# Patient Record
Sex: Female | Born: 1965 | Race: White | Hispanic: No | Marital: Married | Smoking: Never smoker
Health system: Southern US, Community
[De-identification: ages and names within clinical notes are randomized; demographics above are authoritative.]

## PROBLEM LIST (undated history)

## (undated) DIAGNOSIS — E039 Hypothyroidism, unspecified: Secondary | ICD-10-CM

## (undated) DIAGNOSIS — E041 Nontoxic single thyroid nodule: Secondary | ICD-10-CM

## (undated) HISTORY — PX: BREAST SURGERY: SHX581

## (undated) HISTORY — PX: TONSILLECTOMY: SUR1361

---

## 1998-09-21 ENCOUNTER — Ambulatory Visit (HOSPITAL_COMMUNITY): Admission: RE | Admit: 1998-09-21 | Discharge: 1998-09-21 | Payer: Self-pay | Admitting: *Deleted

## 1998-11-03 ENCOUNTER — Observation Stay (HOSPITAL_COMMUNITY): Admission: RE | Admit: 1998-11-03 | Discharge: 1998-11-04 | Payer: Self-pay | Admitting: *Deleted

## 1998-11-20 HISTORY — PX: THYROIDECTOMY, PARTIAL: SHX18

## 2000-08-15 ENCOUNTER — Encounter: Admission: RE | Admit: 2000-08-15 | Discharge: 2000-08-15 | Payer: Self-pay | Admitting: *Deleted

## 2000-08-15 ENCOUNTER — Encounter: Payer: Self-pay | Admitting: *Deleted

## 2000-09-14 ENCOUNTER — Ambulatory Visit (HOSPITAL_COMMUNITY): Admission: RE | Admit: 2000-09-14 | Discharge: 2000-09-14 | Payer: Self-pay | Admitting: Gastroenterology

## 2003-11-26 ENCOUNTER — Other Ambulatory Visit: Admission: RE | Admit: 2003-11-26 | Discharge: 2003-11-26 | Payer: Self-pay | Admitting: Obstetrics and Gynecology

## 2005-10-25 ENCOUNTER — Ambulatory Visit (HOSPITAL_COMMUNITY): Admission: RE | Admit: 2005-10-25 | Discharge: 2005-10-25 | Payer: Self-pay | Admitting: Gastroenterology

## 2005-10-27 ENCOUNTER — Encounter: Admission: RE | Admit: 2005-10-27 | Discharge: 2005-10-27 | Payer: Self-pay | Admitting: Gastroenterology

## 2007-09-20 ENCOUNTER — Inpatient Hospital Stay (HOSPITAL_COMMUNITY): Admission: EM | Admit: 2007-09-20 | Discharge: 2007-09-23 | Payer: Self-pay | Admitting: Emergency Medicine

## 2007-10-23 ENCOUNTER — Encounter: Admission: RE | Admit: 2007-10-23 | Discharge: 2007-10-23 | Payer: Self-pay | Admitting: Endocrinology

## 2007-11-27 ENCOUNTER — Encounter (INDEPENDENT_AMBULATORY_CARE_PROVIDER_SITE_OTHER): Payer: Self-pay | Admitting: Interventional Radiology

## 2007-11-27 ENCOUNTER — Other Ambulatory Visit: Admission: RE | Admit: 2007-11-27 | Discharge: 2007-11-27 | Payer: Self-pay | Admitting: Interventional Radiology

## 2007-11-27 ENCOUNTER — Encounter: Admission: RE | Admit: 2007-11-27 | Discharge: 2007-11-27 | Payer: Self-pay | Admitting: Endocrinology

## 2008-04-20 ENCOUNTER — Encounter: Admission: RE | Admit: 2008-04-20 | Discharge: 2008-04-20 | Payer: Self-pay | Admitting: Endocrinology

## 2009-11-26 ENCOUNTER — Emergency Department (HOSPITAL_BASED_OUTPATIENT_CLINIC_OR_DEPARTMENT_OTHER): Admission: EM | Admit: 2009-11-26 | Discharge: 2009-11-26 | Payer: Self-pay | Admitting: Emergency Medicine

## 2009-11-26 ENCOUNTER — Ambulatory Visit: Payer: Self-pay | Admitting: Diagnostic Radiology

## 2010-07-08 ENCOUNTER — Encounter: Admission: RE | Admit: 2010-07-08 | Discharge: 2010-07-08 | Payer: Self-pay | Admitting: Endocrinology

## 2010-12-11 ENCOUNTER — Encounter: Payer: Self-pay | Admitting: Endocrinology

## 2011-04-03 ENCOUNTER — Other Ambulatory Visit: Payer: Self-pay | Admitting: Obstetrics
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
## 2011-04-07 NOTE — Procedures (Signed)
[place]  Patient:    Ruth Soto, Ruth Soto                   MRN: [id number] Proc. Date: 09/14/00 Adm. Date:  [id number] Attending:  Charna Elizabeth CC:         Gail________, M.D., [facility]   Procedure Report  DATE OF BIRTH:  1966-04-24  PROCEDURE:  Colonoscopy.  ENDOSCOPIST:  Anselmo Rod, M.D.  INSTRUMENT USED:  Olympus video colonoscope.  INDICATIONS:  [age]-year-old white female with a history of rectal bleeding and family history in a brother who was diagnosed at 70 years of age. Rule out colonic polyps, masses, hemorrhoids, etc.  PREPROCEDURE PREPARATION:  Informed consent was procured from the patient. The patient was fasted for eight hours prior to the procedure and prepped with a bottle of magnesium citrate and a gallon of NuLytely the night prior to the procedure.  PREPROCEDURE PHYSICAL EXAMINATION:  VITAL SIGNS:  The patient has stable vital signs.  NECK:  Supple.  CHEST:  Clear to auscultation.  HEART:  S1 and S2 regular.  ABDOMEN:  Soft with normal abdominal bowel sounds.  DESCRIPTION OF PROCEDURE:  The patient was placed in the left lateral decubitus position and sedated with 50 mg of Demerol and 4 mg of Versed intravenously.  Once the patient was adequately sedated and maintained on low flow oxygen and continuous cardiac monitoring, the Olympus video colonoscope was advanced from the rectum to the cecum without difficulty.  No masses, polyps, erosions, ulcerations, diverticula, or other abnormalities were seen. There were small nonbleeding internal hemorrhoids seen on retroflexion in the rectum.  The patient tolerated the procedure well without complications.  IMPRESSION:  Essentially normal colonoscopy except for small internal hemorrhoids.  RECOMMENDATIONS: 1. The patient was advised to increase the fiber in her diet. 2. Consider the history of colon cancer in her brother, repeat colorectal    cancer screening has been recommended in the next 5 years the patient has    any abnormal symptoms in the interim. 3. Outpatient follow-up is advised on a p.r.n. basis. DD:  09/14/00 TD:  09/14/00 Job: [id number]

## 2011-04-07 NOTE — Op Note (Signed)
NAME:  Ruth Soto, Ruth Soto               ACCOUNT NO.:  [id number]   MEDICAL RECORD NO.:  [id number]          PATIENT TYPE:  AMB   LOCATION:  ENDO                         FACILITY:  [facility]   PHYSICIAN:  Anselmo Rod, M.D.  DATE OF BIRTH:  1966-11-11   DATE OF PROCEDURE:  10/25/2005  DATE OF DISCHARGE:                                 OPERATIVE REPORT   PROCEDURE PERFORMED:  Screening colonoscopy.   ENDOSCOPIST:  Anselmo Rod, M.D.   INSTRUMENT USED:  Olympus video colonoscope.   INDICATIONS FOR PROCEDURE:  A 45 year old white female with a family history  of colon cancer in  multiple family members including a brother who was  diagnosed at the age of 42 undergoing colonoscopy for rectal bleeding,  change in bowel habits, rule out colonic polyps, masses, etc.   PREPROCEDURE PREPARATION:  Informed consent was procured from the patient.  The patient fasted for four hours prior to the procedure and prepped with  Osmoprep pills the night of and the morning of the procedure.  Risks and  benefits of the procedure including a 10% miss rate of cancer and polyps was  discussed with the patient as well.   PREPROCEDURE PHYSICAL:  VITAL SIGNS:  Stable vital signs.  NECK:  Supple.  CHEST:  Clear to auscultation.  CARDIOVASCULAR:  S1 and S2 regular.  ABDOMEN:  Soft with normal bowel sounds.   DESCRIPTION OF PROCEDURE:  The patient was placed in left lateral decubitus  position, sedated with 100 mcg of Fentanyl and 10 mg of Versed in slow  incremental doses.  Once the patient was adequately sedated and maintained  on low flow oxygen and continuous cardiac monitoring, the Olympus video  colonoscope was advanced from the rectum to the cecum.  The patient had  fairly good prep.  Small internal hemorrhoids and a small anal fissure were  noted on anal inspection and retroflexion, respectively.  The rest of the  exam was unremarkable.  The appendiceal orifice and ileocecal valve were  clearly  visualized and photographed.  No other masses, polyps, erosions or  ulcerations or diverticula  were seen.  The patient tolerated the procedure  well without any immediate complications.   IMPRESSION:  Normal colonoscopy up the cecum except for small internal  hemorrhoids and a small anal fissure.   RECOMMENDATIONS:  1.Continue high fiber diet with liberal fluid intake.  2.SITZ baths along with local steroid application in the rectum has been  recommended.  3.Outpatient follow-up as need arises in the future.      Anselmo Rod, M.D.  Electronically Signed     JNM/MEDQ  D:  10/25/2005  T:  10/25/2005  Job:  [id number]   cc:   Maryla Morrow. Modesto Charon, M.D.  Fax: [phone]

## 2011-08-29 LAB — BASIC METABOLIC PANEL
Chloride: 105
Creatinine, Ser: 0.98
GFR calc Af Amer: >60
GFR calc non Af Amer: >60
Glucose, Bld: 144 — ABNORMAL HIGH

## 2011-08-29 LAB — CBC
Hemoglobin: 12.7
MCV: 91.8
Platelets: 354
RDW: 13.5
WBC: 12.6 — ABNORMAL HIGH

## 2011-08-30 LAB — URINE MICROSCOPIC-ADD ON

## 2011-08-30 LAB — I-STAT 8, (EC8 V) (CONVERTED LAB)
Acid-base deficit: 6 — ABNORMAL HIGH
BUN: 10
Bicarbonate: 19.1 — ABNORMAL LOW
Chloride: 105
Glucose, Bld: 134 — ABNORMAL HIGH
HCT: 45
Hemoglobin: 15.3 — ABNORMAL HIGH
Operator id: 234501
Potassium: 3 — ABNORMAL LOW
Sodium: 138
TCO2: 20
pCO2, Ven: 35 — ABNORMAL LOW
pH, Ven: 7.345 — ABNORMAL HIGH

## 2011-08-30 LAB — URINALYSIS, ROUTINE W REFLEX MICROSCOPIC
Ketones, ur: NEGATIVE
Leukocytes, UA: NEGATIVE
Nitrite: POSITIVE — AB
Protein, ur: >300 — AB
Specific Gravity, Urine: 1.025
Urobilinogen, UA: 1

## 2011-08-30 LAB — POCT I-STAT CREATININE
Creatinine, Ser: 1
Operator id: 234501

## 2012-03-08 ENCOUNTER — Other Ambulatory Visit: Payer: Self-pay | Admitting: Endocrinology

## 2012-03-08 DIAGNOSIS — E049 Nontoxic goiter, unspecified: Secondary | ICD-10-CM

## 2012-03-29 ENCOUNTER — Other Ambulatory Visit: Payer: Self-pay

## 2012-04-02 ENCOUNTER — Ambulatory Visit
Admission: RE | Admit: 2012-04-02 | Discharge: 2012-04-02 | Disposition: A | Discharge status: Home / Self Care | Payer: 59 | Source: Ambulatory Visit | Attending: Endocrinology | Admitting: Endocrinology

## 2012-04-02 DIAGNOSIS — E049 Nontoxic goiter, unspecified: Secondary | ICD-10-CM

## 2013-03-06 ENCOUNTER — Other Ambulatory Visit: Payer: Self-pay | Admitting: Endocrinology

## 2013-03-06 DIAGNOSIS — E049 Nontoxic goiter, unspecified: Secondary | ICD-10-CM

## 2013-06-02 ENCOUNTER — Other Ambulatory Visit: Payer: 59

## 2013-06-06 ENCOUNTER — Ambulatory Visit
Admission: RE | Admit: 2013-06-06 | Discharge: 2013-06-06 | Disposition: A | Discharge status: Home / Self Care | Payer: BC Managed Care – PPO | Source: Ambulatory Visit | Attending: Endocrinology | Admitting: Endocrinology

## 2013-06-06 DIAGNOSIS — E049 Nontoxic goiter, unspecified: Secondary | ICD-10-CM

## 2014-04-25 IMAGING — US US SOFT TISSUE HEAD/NECK
1 series · 14 of 25 positions shown · non-contrast
Comparison: 04/02/2012 and earlier studies

CLINICAL DATA: Goiter.  Previous FNA biopsy of the to right-sided
lesions 11/27/2007.  Previous left partial thyroidectomy.

THYROID ULTRASOUND
TECHNIQUE: Ultrasound examination of the thyroid gland and adjacent
soft tissues was performed.

[Series 1: us soft tissue head/neck · 0.06mm/px · 14 of 52 slices shown]
[im 1/52]
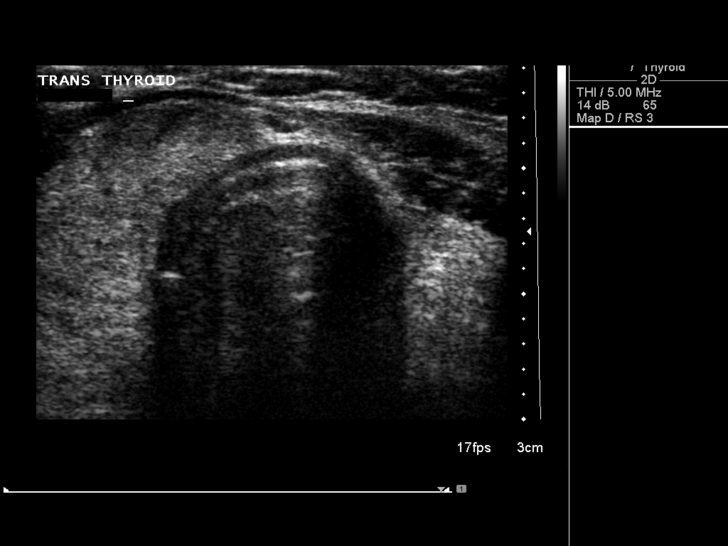
[im 5/52]
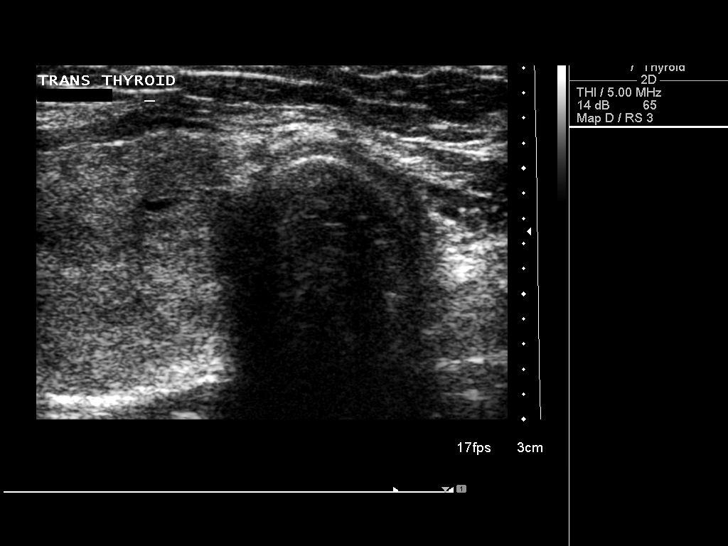
[im 9/52]
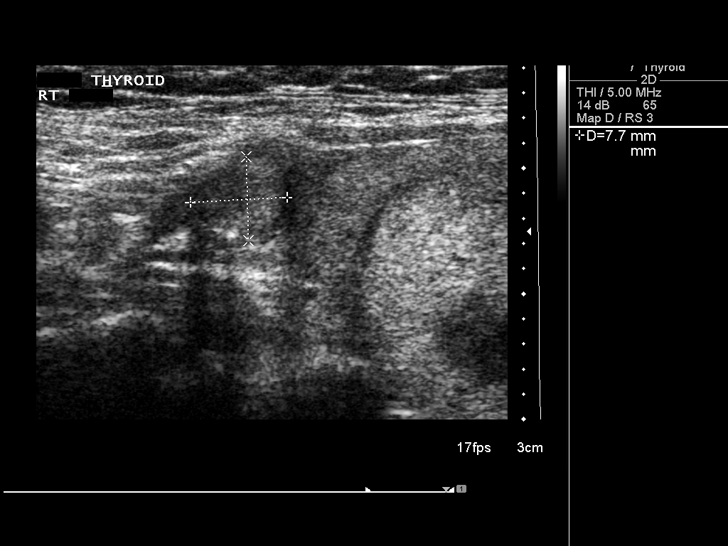
[im 13/52]
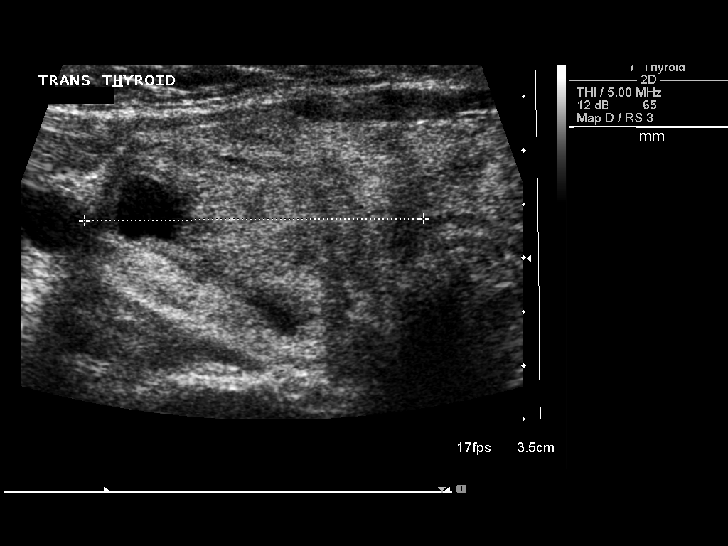
[im 18/52]
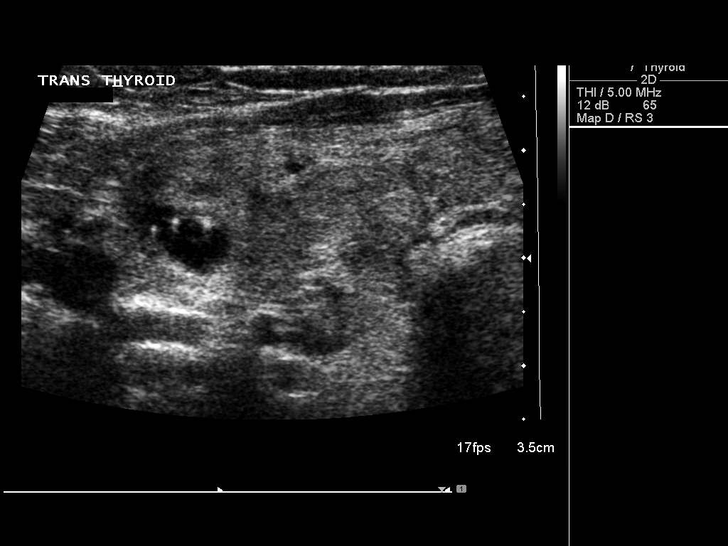
[im 20/52]
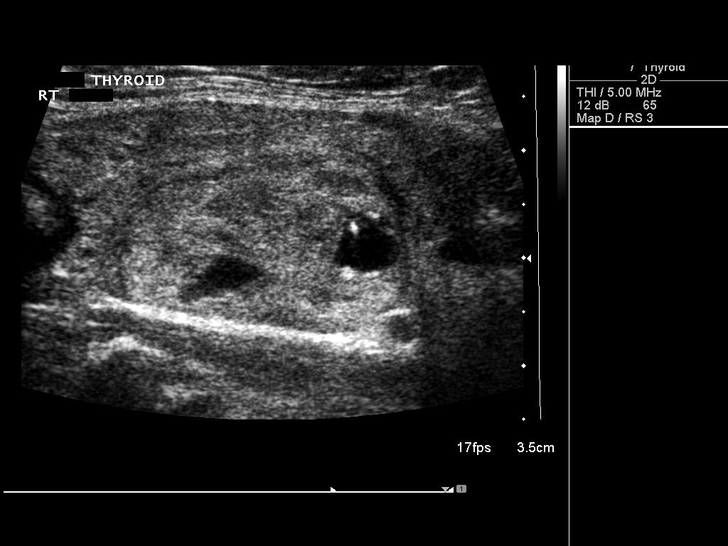
[im 24/52]
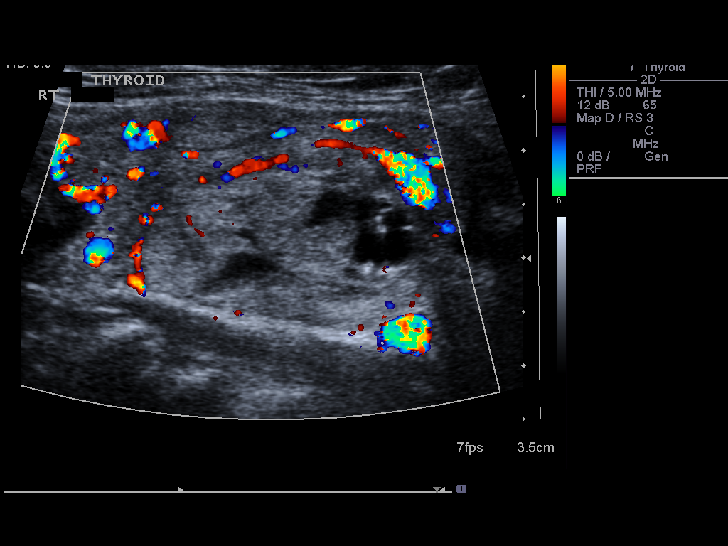
[im 28/52]
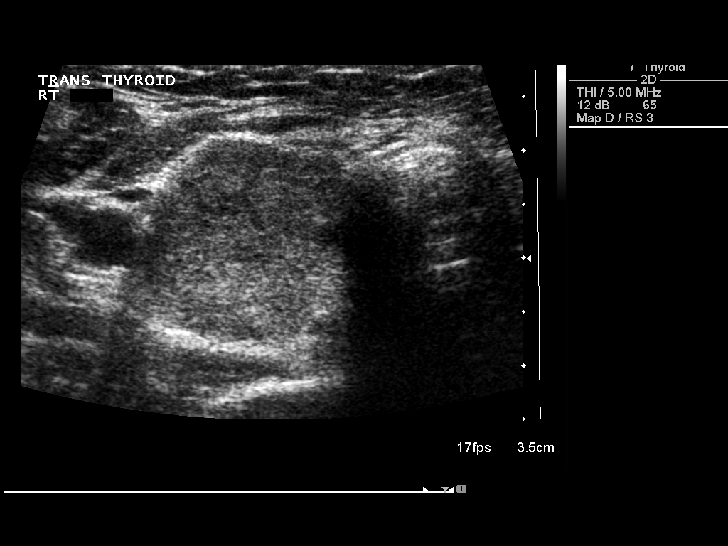
[im 32/52]
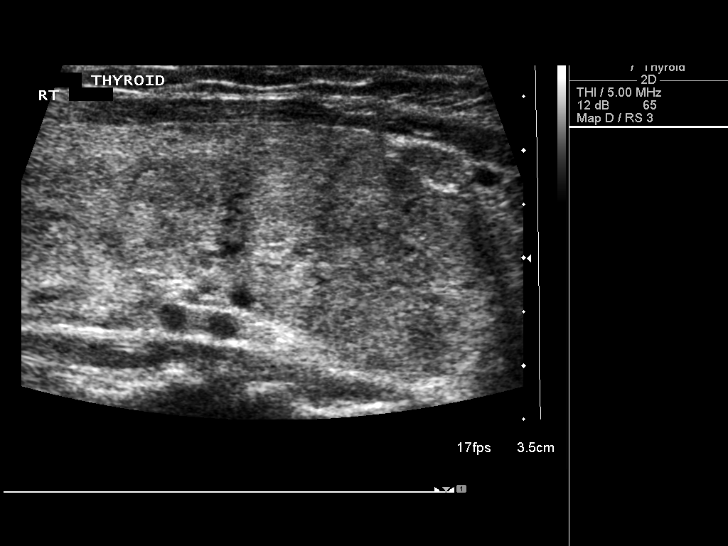
[im 35/52]
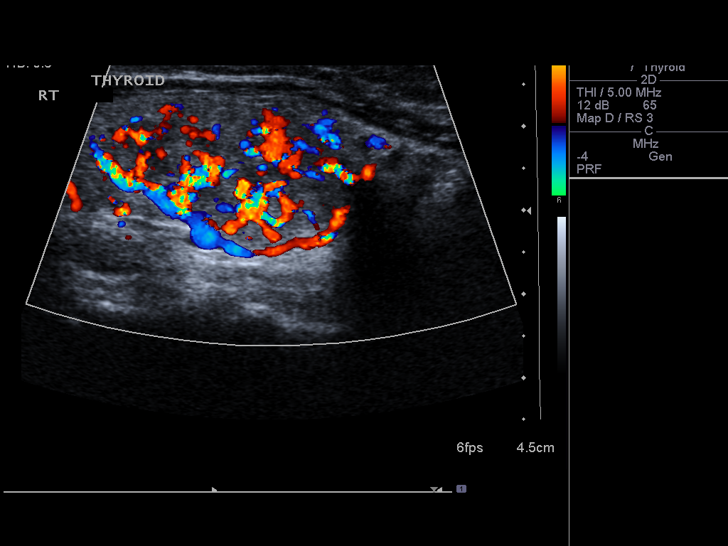
[im 39/52]
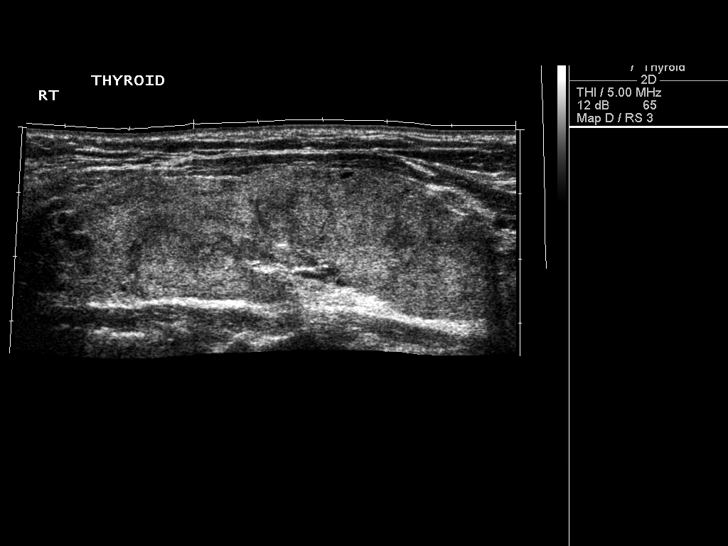
[im 43/52]
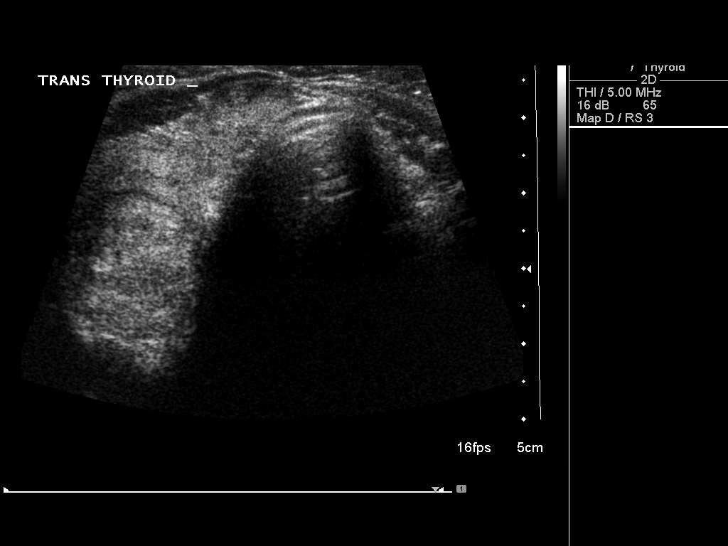
[im 47/52]
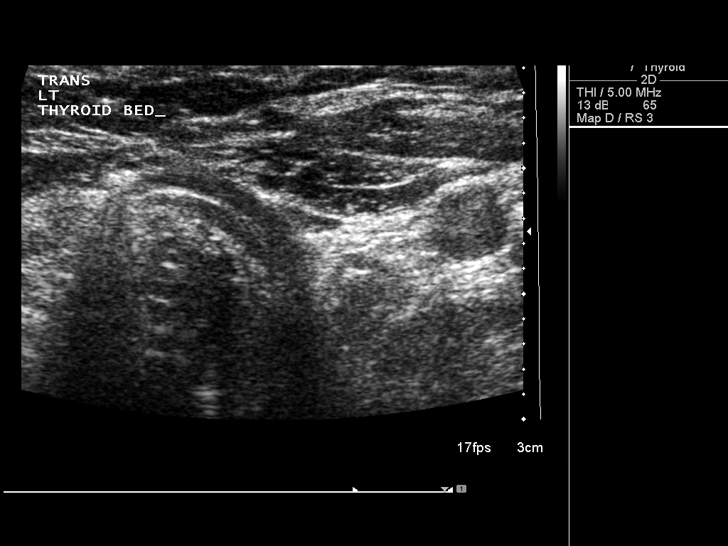
[im 52/52]
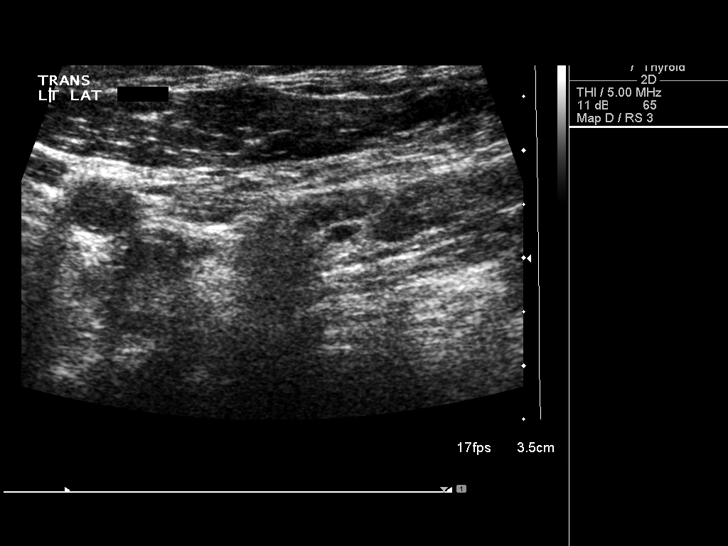

[14 of 25 positions shown; findings below may reference images not displayed]

FINDINGS: Right thyroid lobe:  8 x 2.5 x 3.2 cm, inhomogeneous echotexture
Left thyroid lobe:  Surgically absent without any residual or
recurrent tissue evident
Isthmus:  5.5 mm in thickness

Focal nodules:  7 x 8 x 9 mm solid with peripheral calcifications,
superior right
x 27)

Lymphadenopathy:  None visualized.
IMPRESSION: 1.  Stable nodules in an enlarged right thyroid lobe.  Correlate
with previous biopsy results.
2.  Changes of left hemithyroidectomy without residual or recurrent
tissue evident.

## 2014-08-07 ENCOUNTER — Other Ambulatory Visit: Payer: Self-pay | Admitting: Endocrinology

## 2014-08-07 DIAGNOSIS — E049 Nontoxic goiter, unspecified: Secondary | ICD-10-CM

## 2014-08-10 ENCOUNTER — Ambulatory Visit
Admission: RE | Admit: 2014-08-10 | Discharge: 2014-08-10 | Disposition: A | Discharge status: Home / Self Care | Payer: BC Managed Care – PPO | Source: Ambulatory Visit | Attending: Endocrinology | Admitting: Endocrinology

## 2014-08-10 DIAGNOSIS — E049 Nontoxic goiter, unspecified: Secondary | ICD-10-CM

## 2014-09-09 ENCOUNTER — Other Ambulatory Visit: Payer: Self-pay | Admitting: Obstetrics

## 2014-09-09 DIAGNOSIS — R928 Other abnormal and inconclusive findings on diagnostic imaging of breast: Secondary | ICD-10-CM

## 2014-09-18 ENCOUNTER — Ambulatory Visit
Admission: RE | Admit: 2014-09-18 | Discharge: 2014-09-18 | Disposition: A | Discharge status: Home / Self Care | Payer: BC Managed Care – PPO | Source: Ambulatory Visit | Attending: Obstetrics | Admitting: Obstetrics

## 2014-09-18 DIAGNOSIS — R928 Other abnormal and inconclusive findings on diagnostic imaging of breast: Secondary | ICD-10-CM

## 2015-04-07 ENCOUNTER — Other Ambulatory Visit: Payer: Self-pay | Admitting: Obstetrics

## 2015-04-07 DIAGNOSIS — R921 Mammographic calcification found on diagnostic imaging of breast: Secondary | ICD-10-CM

## 2015-04-15 ENCOUNTER — Ambulatory Visit
Admission: RE | Admit: 2015-04-15 | Discharge: 2015-04-15 | Disposition: A | Discharge status: Home / Self Care | Payer: BLUE CROSS/BLUE SHIELD | Source: Ambulatory Visit | Attending: Obstetrics | Admitting: Obstetrics

## 2015-04-15 ENCOUNTER — Other Ambulatory Visit: Payer: Self-pay | Admitting: Obstetrics

## 2015-04-15 DIAGNOSIS — R921 Mammographic calcification found on diagnostic imaging of breast: Secondary | ICD-10-CM

## 2015-04-20 ENCOUNTER — Other Ambulatory Visit: Payer: Self-pay | Admitting: Obstetrics

## 2015-04-20 DIAGNOSIS — R921 Mammographic calcification found on diagnostic imaging of breast: Secondary | ICD-10-CM

## 2015-04-22 ENCOUNTER — Ambulatory Visit
Admission: RE | Admit: 2015-04-22 | Discharge: 2015-04-22 | Disposition: A | Discharge status: Home / Self Care | Payer: BLUE CROSS/BLUE SHIELD | Source: Ambulatory Visit | Attending: Obstetrics | Admitting: Obstetrics

## 2015-04-22 DIAGNOSIS — R921 Mammographic calcification found on diagnostic imaging of breast: Secondary | ICD-10-CM

## 2015-04-26 ENCOUNTER — Ambulatory Visit: Payer: Self-pay | Admitting: Surgery

## 2015-04-26 DIAGNOSIS — D242 Benign neoplasm of left breast: Secondary | ICD-10-CM

## 2015-04-26 NOTE — H&P (Signed)
risty Tutton 04/26/2015 11:46 AM Location: [facility] Patient #: [id number] DOB: 1966/09/05 Married / Language: Cleophus Molt / Race: White Female History of Present Illness Marcello Moores A. Ajax Schroll MD; 04/26/2015 12:54 PM) Patient words: check breast lump Pt sent at the request of Dr Shelly Bombard for left breast papilloma and calcifications on mammogram. No hx of mass, pain or discharge. Had a milk duct excisied many rears ago.       CLINICAL DATA: Followup of calcifications in the left breast. Patient has a history of and the prior surgical excision of the left breast, per the patient in 2001. She states she had a milk duct removed at that time. EXAM: DIGITAL DIAGNOSTIC LEFT MAMMOGRAM WITH 3D TOMOSYNTHESIS AND CAD COMPARISON: 09/18/2014, 09/01/2014 ACR Breast Density Category: B. There are scattered fibroglandular densities. FINDINGS: No distortion is identified in the left breast. There is a is tubular density in the anterior third of the outer left breast slightly superior to the level of the nipple that is associated with a 12 x 15 x 13 mm group of coarse, heterogeneous calcifications. Some of the calcifications are linearly oriented. Mammographic images were processed with CAD. IMPRESSION: Group of heterogeneous calcifications with associated density in the anterior third of the left breast. Stereotactic biopsy is suggested. RECOMMENDATION: Stereotactic biopsy of left breast calcifications is recommended. This was scheduled for the patient, for next Thursday, April 22, 2015. I have discussed the findings and recommendations with the patient. Results were also provided in writing at the conclusion of the visit. If applicable, a reminder letter will be sent to the patient regarding the next appointment. BI-RADS CATEGORY 4: Suspicious. Electronically Signed By: Curlene Dolphin M.D. On: 04/15/2015 14:28   CLINICAL DATA: Post biopsy of calcifications in the upper-outer left  breast.  EXAM: DIAGNOSTIC LEFT MAMMOGRAM POST STEREOTACTIC BIOPSY  COMPARISON: Previous exam(s).  FINDINGS: Mammographic images were obtained following stereotactic guided biopsy of a loose group of calcifications in the upper-outer left breast anterior depth. A coil shaped biopsy marking clip is present in the targeted region of the left breast calcifications.  IMPRESSION: Appropriate positioning of coil shaped biopsy marking clip post biopsy of calcifications in the upper-outer left breast.  Final Assessment: Post Procedure Mammograms for Marker Placement   Electronically Signed By: Everlean Alstrom M.D. On: 04/22/2015 09:50         Breast, left, needle core biopsy, upper outer - SCLEROSED INTRADUCTAL PAPILLOMA WITH CALCIFICATIONS. - SEE COMMENT. Microscopic Comment The results were called to [facility] on 04/23/15. (JBK:gt, 04/23/15).  The patient is a 49 year old female   Other Problems Elbert Ewings, CMA; 04/26/2015 11:47 AM) Lump In Breast Thyroid Disease  Past Surgical History Elbert Ewings, CMA; 04/26/2015 11:47 AM) Breast Biopsy Left. Cesarean Section - 1 Oral Surgery Thyroid Surgery Tonsillectomy  Diagnostic Studies History Elbert Ewings, CMA; 04/26/2015 11:47 AM) Colonoscopy 5-10 years ago Mammogram within last year Pap Smear 1-5 years ago  Allergies Elbert Ewings, CMA; 04/26/2015 11:47 AM) No Known Drug Allergies 04/26/2015  Medication History Elbert Ewings, CMA; 04/26/2015 11:47 AM) Levothyroxine Sodium (50MCG Tablet, Oral) Active. Medications Reconciled  Social History Elbert Ewings, [place]; 04/26/2015 11:47 AM) Alcohol use Moderate alcohol use. Caffeine use Carbonated beverages, Coffee, Tea. No drug use Tobacco use Current some day smoker.  Family History Elbert Ewings, [place]; 04/26/2015 11:47 AM) Arthritis Mother. Colon Cancer Brother. Seizure disorder Brother. Thyroid problems Mother.  Pregnancy / Birth  History Elbert Ewings, CMA; 04/26/2015 11:47 AM) Age at menarche 17 years. Contraceptive  History Intrauterine device. Gravida 1 Irregular periods Maternal age 40-25 Para 1     Review of Systems Elbert Ewings CMA; 04/26/2015 11:47 AM) General Not Present- Appetite Loss, Chills, Fatigue, Fever, Night Sweats, Weight Gain and Weight Loss. Skin Not Present- Change in Wart/Mole, Dryness, Hives, Jaundice, New Lesions, Non-Healing Wounds, Rash and Ulcer. HEENT Not Present- Earache, Hearing Loss, Hoarseness, Nose Bleed, Oral Ulcers, Ringing in the Ears, Seasonal Allergies, Sinus Pain, Sore Throat, Visual Disturbances, Wears glasses/contact lenses and Yellow Eyes. Respiratory Not Present- Bloody sputum, Chronic Cough, Difficulty Breathing, Snoring and Wheezing. Breast Present- Breast Mass. Not Present- Breast Pain, Nipple Discharge and Skin Changes. Cardiovascular Not Present- Chest Pain, Difficulty Breathing Lying Down, Leg Cramps, Palpitations, Rapid Heart Rate, Shortness of Breath and Swelling of Extremities. Gastrointestinal Not Present- Abdominal Pain, Bloating, Bloody Stool, Change in Bowel Habits, Chronic diarrhea, Constipation, Difficulty Swallowing, Excessive gas, Gets full quickly at meals, Hemorrhoids, Indigestion, Nausea, Rectal Pain and Vomiting. Female Genitourinary Not Present- Frequency, Nocturia, Painful Urination, Pelvic Pain and Urgency. Musculoskeletal Not Present- Back Pain, Joint Pain, Joint Stiffness, Muscle Pain, Muscle Weakness and Swelling of Extremities. Neurological Not Present- Decreased Memory, Fainting, Headaches, Numbness, Seizures, Tingling, Tremor, Trouble walking and Weakness. Psychiatric Not Present- Anxiety, Bipolar, Change in Sleep Pattern, Depression, Fearful and Frequent crying. Endocrine Not Present- Cold Intolerance, Excessive Hunger, Hair Changes, Heat Intolerance, Hot flashes and New Diabetes. Hematology Not Present- Easy Bruising, Excessive bleeding, Gland  problems, HIV and Persistent Infections.  Vitals Elbert Ewings CMA; 04/26/2015 11:48 AM) 04/26/2015 11:47 AM Weight: 207 lb Height: 67in Body Surface Area: 2.11 m Body Mass Index: 32.42 kg/m Temp.: 99.107F(Oral)  Pulse: 93 (Irregular)  Resp.: 17 (Unlabored)  BP: 128/78 (Sitting, Left Arm, Standard) patient had a cup of hot tea before temp. was taken.    Physical Exam (Tiondra Fang A. Delila Kuklinski MD; 04/26/2015 12:54 PM)  General Mental Status-Alert. General Appearance-Consistent with stated age. Hydration-Well hydrated. Voice-Normal.  Head and Neck Head-normocephalic, atraumatic with no lesions or palpable masses. Trachea-midline. Thyroid Gland Characteristics - normal size and consistency.  Eye Eyeball - Bilateral-Extraocular movements intact. Sclera/Conjunctiva - Bilateral-No scleral icterus.  Chest and Lung Exam Chest and lung exam reveals -quiet, even and easy respiratory effort with no use of accessory muscles and on auscultation, normal breath sounds, no adventitious sounds and normal vocal resonance. Inspection Chest Wall - Normal. Back - normal.  Breast Breast - Left-Symmetric, Non Tender, No Biopsy scars, no Dimpling, No Inflammation, No Lumpectomy scars, No Mastectomy scars, No Peau d' Orange. Breast - Right-Symmetric, Non Tender, No Biopsy scars, no Dimpling, No Inflammation, No Lumpectomy scars, No Mastectomy scars, No Peau d' Orange. Breast Lump-No Palpable Breast Mass. Note: bruising left breast   Cardiovascular Cardiovascular examination reveals -normal heart sounds, regular rate and rhythm with no murmurs and normal pedal pulses bilaterally.  Abdomen Inspection Inspection of the abdomen reveals - No Hernias. Skin - Scar - no surgical scars. Palpation/Percussion Palpation and Percussion of the abdomen reveal - Soft, Non Tender, No Rebound tenderness, No Rigidity (guarding) and No hepatosplenomegaly. Auscultation Auscultation  of the abdomen reveals - Bowel sounds normal.  Neurologic Neurologic evaluation reveals -alert and oriented x 3 with no impairment of recent or remote memory. Mental Status-Normal.  Musculoskeletal Normal Exam - Left-Upper Extremity Strength Normal and Lower Extremity Strength Normal. Normal Exam - Right-Upper Extremity Strength Normal and Lower Extremity Strength Normal.  Lymphatic Head & Neck  General Head & Neck Lymphatics: Bilateral - Description - Normal. Axillary  General Axillary Region: Bilateral -  Description - Normal. Tenderness - Non Tender. Femoral & Inguinal  Generalized Femoral & Inguinal Lymphatics: Bilateral - Description - Normal. Tenderness - Non Tender.    Assessment & Plan (Anyjah Roundtree A. 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 Risk of ocult malignancyaround 3 %. She wishes to proceed with left breast seed localized lumpectomy. Risk of lumpectomy include bleeding, infection, seroma, more surgery, use of seed/wire, wound care, cosmetic deformity and the need for other treatments, death , blood clots, death. Pt agrees to proceed.  Current Plans Pt Education - Benign Tumors: discussed with patient and provided information. Pt Education - Breast Diseases: discussed with patient and provided information. Pt Education - [facility] Breast Biopsy HCI

## 2015-08-02 ENCOUNTER — Other Ambulatory Visit: Payer: Self-pay | Admitting: Endocrinology

## 2015-08-02 DIAGNOSIS — E049 Nontoxic goiter, unspecified: Secondary | ICD-10-CM

## 2015-09-06 ENCOUNTER — Ambulatory Visit: Payer: Self-pay | Admitting: Surgery

## 2015-09-06 ENCOUNTER — Other Ambulatory Visit: Payer: Self-pay | Admitting: Surgery

## 2015-09-06 DIAGNOSIS — D242 Benign neoplasm of left breast: Secondary | ICD-10-CM

## 2015-09-16 ENCOUNTER — Encounter (HOSPITAL_BASED_OUTPATIENT_CLINIC_OR_DEPARTMENT_OTHER): Payer: Self-pay | Admitting: *Deleted

## 2015-09-21 ENCOUNTER — Encounter (HOSPITAL_BASED_OUTPATIENT_CLINIC_OR_DEPARTMENT_OTHER)
Admission: RE | Admit: 2015-09-21 | Discharge: 2015-09-21 | Disposition: A | Discharge status: Home / Self Care | Payer: BLUE CROSS/BLUE SHIELD | Source: Ambulatory Visit | Attending: Surgery | Admitting: Surgery

## 2015-09-21 ENCOUNTER — Ambulatory Visit
Admission: RE | Admit: 2015-09-21 | Discharge: 2015-09-21 | Disposition: A | Discharge status: Home / Self Care | Payer: BLUE CROSS/BLUE SHIELD | Source: Ambulatory Visit | Attending: Surgery | Admitting: Surgery

## 2015-09-21 DIAGNOSIS — Z800 Family history of malignant neoplasm of digestive organs: Secondary | ICD-10-CM | POA: Diagnosis not present

## 2015-09-21 DIAGNOSIS — Z79899 Other long term (current) drug therapy: Secondary | ICD-10-CM | POA: Diagnosis not present

## 2015-09-21 DIAGNOSIS — Z8349 Family history of other endocrine, nutritional and metabolic diseases: Secondary | ICD-10-CM | POA: Diagnosis not present

## 2015-09-21 DIAGNOSIS — D242 Benign neoplasm of left breast: Secondary | ICD-10-CM

## 2015-09-21 DIAGNOSIS — Z8261 Family history of arthritis: Secondary | ICD-10-CM | POA: Diagnosis not present

## 2015-09-21 DIAGNOSIS — E039 Hypothyroidism, unspecified: Secondary | ICD-10-CM | POA: Diagnosis not present

## 2015-09-21 DIAGNOSIS — F17200 Nicotine dependence, unspecified, uncomplicated: Secondary | ICD-10-CM | POA: Diagnosis not present

## 2015-09-21 DIAGNOSIS — Z820 Family history of epilepsy and other diseases of the nervous system: Secondary | ICD-10-CM | POA: Diagnosis not present

## 2015-09-21 LAB — COMPREHENSIVE METABOLIC PANEL
ALBUMIN: 4.5 g/dL (ref 3.5–5.0)
ALT: 38 U/L (ref 14–54)
ANION GAP: 6 (ref 5–15)
AST: 32 U/L (ref 15–41)
Alkaline Phosphatase: 82 U/L (ref 38–126)
BUN: 11 mg/dL (ref 6–20)
CHLORIDE: 103 mmol/L (ref 101–111)
CO2: 29 mmol/L (ref 22–32)
Calcium: 9.1 mg/dL (ref 8.9–10.3)
Creatinine, Ser: 0.8 mg/dL (ref 0.44–1.00)
GFR calc non Af Amer: >60 mL/min (ref >60)
GLUCOSE: 103 mg/dL — AB (ref 65–99)
POTASSIUM: 5.2 mmol/L — AB (ref 3.5–5.1)
SODIUM: 138 mmol/L (ref 135–145)
Total Bilirubin: 0.7 mg/dL (ref 0.3–1.2)
Total Protein: 7.4 g/dL (ref 6.5–8.1)

## 2015-09-21 LAB — CBC WITH DIFFERENTIAL/PLATELET
BASOS PCT: 1 %
Basophils Absolute: 0 10*3/uL (ref 0.0–0.1)
EOS PCT: 2 %
Eosinophils Absolute: 0.2 10*3/uL (ref 0.0–0.7)
HCT: 47.2 % — ABNORMAL HIGH (ref 36.0–46.0)
HEMOGLOBIN: 16.1 g/dL — AB (ref 12.0–15.0)
LYMPHS PCT: 31 %
Lymphs Abs: 2.4 10*3/uL (ref 0.7–4.0)
MCH: 32.1 pg (ref 26.0–34.0)
MCHC: 34.1 g/dL (ref 30.0–36.0)
MCV: 94.2 fL (ref 78.0–100.0)
Monocytes Absolute: 0.5 10*3/uL (ref 0.1–1.0)
Monocytes Relative: 7 %
Neutro Abs: 4.5 10*3/uL (ref 1.7–7.7)
Neutrophils Relative %: 59 %
PLATELETS: 324 10*3/uL (ref 150–400)
RBC: 5.01 MIL/uL (ref 3.87–5.11)
RDW: 12.8 % (ref 11.5–15.5)
WBC: 7.5 10*3/uL (ref 4.0–10.5)

## 2015-09-23 ENCOUNTER — Encounter (HOSPITAL_BASED_OUTPATIENT_CLINIC_OR_DEPARTMENT_OTHER): Payer: Self-pay | Admitting: Anesthesiology

## 2015-09-23 ENCOUNTER — Ambulatory Visit (HOSPITAL_BASED_OUTPATIENT_CLINIC_OR_DEPARTMENT_OTHER)
Admission: RE | Admit: 2015-09-23 | Discharge: 2015-09-23 | Disposition: A | Discharge status: Home / Self Care | Payer: BLUE CROSS/BLUE SHIELD | Source: Ambulatory Visit | Attending: Surgery | Admitting: Surgery

## 2015-09-23 ENCOUNTER — Ambulatory Visit (HOSPITAL_BASED_OUTPATIENT_CLINIC_OR_DEPARTMENT_OTHER): Payer: BLUE CROSS/BLUE SHIELD | Admitting: Anesthesiology

## 2015-09-23 ENCOUNTER — Encounter (HOSPITAL_BASED_OUTPATIENT_CLINIC_OR_DEPARTMENT_OTHER)
Admission: RE | Disposition: A | Discharge status: Home / Self Care | Payer: Self-pay | Source: Ambulatory Visit | Attending: Surgery

## 2015-09-23 ENCOUNTER — Ambulatory Visit
Admission: RE | Admit: 2015-09-23 | Discharge: 2015-09-23 | Disposition: A | Discharge status: Home / Self Care | Payer: BLUE CROSS/BLUE SHIELD | Source: Ambulatory Visit | Attending: Surgery | Admitting: Surgery

## 2015-09-23 DIAGNOSIS — D242 Benign neoplasm of left breast: Secondary | ICD-10-CM | POA: Diagnosis not present

## 2015-09-23 DIAGNOSIS — Z8349 Family history of other endocrine, nutritional and metabolic diseases: Secondary | ICD-10-CM | POA: Insufficient documentation

## 2015-09-23 DIAGNOSIS — F17200 Nicotine dependence, unspecified, uncomplicated: Secondary | ICD-10-CM | POA: Insufficient documentation

## 2015-09-23 DIAGNOSIS — Z8261 Family history of arthritis: Secondary | ICD-10-CM | POA: Insufficient documentation

## 2015-09-23 DIAGNOSIS — Z79899 Other long term (current) drug therapy: Secondary | ICD-10-CM | POA: Insufficient documentation

## 2015-09-23 DIAGNOSIS — Z820 Family history of epilepsy and other diseases of the nervous system: Secondary | ICD-10-CM | POA: Insufficient documentation

## 2015-09-23 DIAGNOSIS — E039 Hypothyroidism, unspecified: Secondary | ICD-10-CM | POA: Insufficient documentation

## 2015-09-23 DIAGNOSIS — Z800 Family history of malignant neoplasm of digestive organs: Secondary | ICD-10-CM | POA: Insufficient documentation

## 2015-09-23 HISTORY — DX: Hypothyroidism, unspecified: E03.9

## 2015-09-23 HISTORY — DX: Nontoxic single thyroid nodule: E04.1

## 2015-09-23 HISTORY — PX: BREAST LUMPECTOMY WITH RADIOACTIVE SEED LOCALIZATION: SHX6424

## 2015-09-23 SURGERY — BREAST LUMPECTOMY WITH RADIOACTIVE SEED LOCALIZATION
Anesthesia: General | Site: Breast | Laterality: Left

## 2015-09-23 MED ORDER — OXYCODONE HCL 5 MG/5ML PO SOLN: 5.0000 mg | Freq: Once | ORAL | Status: AC | PRN

## 2015-09-23 MED ORDER — FENTANYL CITRATE (PF) 100 MCG/2ML IJ SOLN
INTRAMUSCULAR | Status: AC
  Filled 2015-09-23: qty 4

## 2015-09-23 MED ORDER — OXYCODONE HCL 5 MG PO TABS
5.0000 mg | ORAL_TABLET | Freq: Once | ORAL | Status: AC | PRN
  Administered 2015-09-23: 5 mg via ORAL

## 2015-09-23 MED ORDER — LIDOCAINE HCL (CARDIAC) 20 MG/ML IV SOLN
INTRAVENOUS | Status: DC | PRN
  Administered 2015-09-23: 50 mg via INTRAVENOUS

## 2015-09-23 MED ORDER — MIDAZOLAM HCL 2 MG/2ML IJ SOLN
1.0000 mg | INTRAMUSCULAR | Status: DC | PRN
  Administered 2015-09-23: 2 mg via INTRAVENOUS

## 2015-09-23 MED ORDER — DEXAMETHASONE SODIUM PHOSPHATE 4 MG/ML IJ SOLN
INTRAMUSCULAR | Status: DC | PRN
  Administered 2015-09-23: 10 mg via INTRAVENOUS

## 2015-09-23 MED ORDER — ONDANSETRON HCL 4 MG/2ML IJ SOLN
INTRAMUSCULAR | Status: DC | PRN
  Administered 2015-09-23: 4 mg via INTRAVENOUS

## 2015-09-23 MED ORDER — 0.9 % SODIUM CHLORIDE (POUR BTL) OPTIME
TOPICAL | Status: DC | PRN
  Administered 2015-09-23: 200 mL

## 2015-09-23 MED ORDER — PROPOFOL 500 MG/50ML IV EMUL
INTRAVENOUS | Status: AC
  Filled 2015-09-23: qty 50

## 2015-09-23 MED ORDER — SCOPOLAMINE 1 MG/3DAYS TD PT72: 1.0000 | MEDICATED_PATCH | Freq: Once | TRANSDERMAL | Status: DC | PRN

## 2015-09-23 MED ORDER — MEPERIDINE HCL 25 MG/ML IJ SOLN: 6.2500 mg | INTRAMUSCULAR | Status: DC | PRN

## 2015-09-23 MED ORDER — OXYCODONE HCL 5 MG PO TABS
ORAL_TABLET | ORAL | Status: AC
  Filled 2015-09-23: qty 1

## 2015-09-23 MED ORDER — PROPOFOL 10 MG/ML IV BOLUS
INTRAVENOUS | Status: DC | PRN
  Administered 2015-09-23: 200 mg via INTRAVENOUS

## 2015-09-23 MED ORDER — MIDAZOLAM HCL 2 MG/2ML IJ SOLN
INTRAMUSCULAR | Status: AC
  Filled 2015-09-23: qty 4

## 2015-09-23 MED ORDER — HYDROMORPHONE HCL 1 MG/ML IJ SOLN
0.2500 mg | INTRAMUSCULAR | Status: DC | PRN
  Administered 2015-09-23 (×2): 0.5 mg via INTRAVENOUS

## 2015-09-23 MED ORDER — CHLORHEXIDINE GLUCONATE 4 % EX LIQD: 1.0000 "application " | Freq: Once | CUTANEOUS | Status: DC

## 2015-09-23 MED ORDER — OXYCODONE-ACETAMINOPHEN 5-325 MG PO TABS: 1.0000 | ORAL_TABLET | ORAL | Status: AC | PRN

## 2015-09-23 MED ORDER — LIDOCAINE HCL (CARDIAC) 20 MG/ML IV SOLN
INTRAVENOUS | Status: AC
  Filled 2015-09-23: qty 5

## 2015-09-23 MED ORDER — HYDROMORPHONE HCL 1 MG/ML IJ SOLN
INTRAMUSCULAR | Status: AC
  Filled 2015-09-23: qty 1

## 2015-09-23 MED ORDER — FENTANYL CITRATE (PF) 100 MCG/2ML IJ SOLN
50.0000 ug | INTRAMUSCULAR | Status: AC | PRN
  Administered 2015-09-23: 100 ug via INTRAVENOUS
  Administered 2015-09-23 (×2): 50 ug via INTRAVENOUS

## 2015-09-23 MED ORDER — ONDANSETRON HCL 4 MG/2ML IJ SOLN
INTRAMUSCULAR | Status: AC
  Filled 2015-09-23: qty 2

## 2015-09-23 MED ORDER — GLYCOPYRROLATE 0.2 MG/ML IJ SOLN: 0.2000 mg | Freq: Once | INTRAMUSCULAR | Status: DC | PRN

## 2015-09-23 MED ORDER — DEXTROSE 5 % IV SOLN
3.0000 g | INTRAVENOUS | Status: AC
  Administered 2015-09-23: 2 g via INTRAVENOUS

## 2015-09-23 MED ORDER — BUPIVACAINE-EPINEPHRINE (PF) 0.25% -1:200000 IJ SOLN
INTRAMUSCULAR | Status: DC | PRN
  Administered 2015-09-23: 10 mL

## 2015-09-23 MED ORDER — DEXAMETHASONE SODIUM PHOSPHATE 10 MG/ML IJ SOLN
INTRAMUSCULAR | Status: AC
  Filled 2015-09-23: qty 1

## 2015-09-23 MED ORDER — LACTATED RINGERS IV SOLN
INTRAVENOUS | Status: DC
  Administered 2015-09-23: 10:00:00 via INTRAVENOUS

## 2015-09-23 MED ORDER — CEFAZOLIN SODIUM-DEXTROSE 2-3 GM-% IV SOLR
INTRAVENOUS | Status: AC
  Filled 2015-09-23: qty 50

## 2015-09-23 SURGICAL SUPPLY — 51 items
APPLIER CLIP 9.375 MED OPEN (MISCELLANEOUS)
APR CLP MED 9.3 20 MLT OPN (MISCELLANEOUS)
BINDER BREAST LRG (GAUZE/BANDAGES/DRESSINGS) IMPLANT
BINDER BREAST MEDIUM (GAUZE/BANDAGES/DRESSINGS) IMPLANT
BINDER BREAST XLRG (GAUZE/BANDAGES/DRESSINGS) ×2 IMPLANT
BINDER BREAST XXLRG (GAUZE/BANDAGES/DRESSINGS) IMPLANT
BLADE SURG 15 STRL LF DISP TIS (BLADE) ×1 IMPLANT
BLADE SURG 15 STRL SS (BLADE) ×3
CANISTER SUC SOCK COL 7IN (MISCELLANEOUS) ×3 IMPLANT
CANISTER SUCT 1200ML W/VALVE (MISCELLANEOUS) IMPLANT
CHLORAPREP W/TINT 26ML (MISCELLANEOUS) ×3 IMPLANT
CLIP APPLIE 9.375 MED OPEN (MISCELLANEOUS) IMPLANT
COVER BACK TABLE 60X90IN (DRAPES) ×3 IMPLANT
COVER MAYO STAND STRL (DRAPES) ×3 IMPLANT
COVER PROBE W GEL 5X96 (DRAPES) ×3 IMPLANT
DECANTER SPIKE VIAL GLASS SM (MISCELLANEOUS) IMPLANT
DEVICE DUBIN W/COMP PLATE 8390 (MISCELLANEOUS) ×3 IMPLANT
DRAPE LAPAROSCOPIC ABDOMINAL (DRAPES) IMPLANT
DRAPE LAPAROTOMY 100X72 PEDS (DRAPES) ×3 IMPLANT
DRAPE UTILITY XL STRL (DRAPES) ×3 IMPLANT
ELECT COATED BLADE 2.86 ST (ELECTRODE) ×3 IMPLANT
ELECT REM PT RETURN 9FT ADLT (ELECTROSURGICAL) ×3
ELECTRODE REM PT RTRN 9FT ADLT (ELECTROSURGICAL) ×1 IMPLANT
GLOVE BIO SURGEON STRL SZ 6.5 (GLOVE) ×1 IMPLANT
GLOVE BIO SURGEONS STRL SZ 6.5 (GLOVE) ×1
GLOVE BIOGEL PI IND STRL 7.0 (GLOVE) IMPLANT
GLOVE BIOGEL PI IND STRL 8 (GLOVE) ×1 IMPLANT
GLOVE BIOGEL PI INDICATOR 7.0 (GLOVE) ×4
GLOVE BIOGEL PI INDICATOR 8 (GLOVE) ×2
GLOVE ECLIPSE 8.0 STRL XLNG CF (GLOVE) ×3 IMPLANT
GOWN STRL REUS W/ TWL LRG LVL3 (GOWN DISPOSABLE) ×2 IMPLANT
GOWN STRL REUS W/TWL LRG LVL3 (GOWN DISPOSABLE) ×6
HEMOSTAT SNOW SURGICEL 2X4 (HEMOSTASIS) IMPLANT
KIT MARKER MARGIN INK (KITS) ×3 IMPLANT
LIQUID BAND (GAUZE/BANDAGES/DRESSINGS) ×3 IMPLANT
NDL HYPO 25X1 1.5 SAFETY (NEEDLE) ×1 IMPLANT
NEEDLE HYPO 25X1 1.5 SAFETY (NEEDLE) ×3 IMPLANT
NS IRRIG 1000ML POUR BTL (IV SOLUTION) ×3 IMPLANT
PACK BASIN DAY SURGERY FS (CUSTOM PROCEDURE TRAY) ×3 IMPLANT
PENCIL BUTTON HOLSTER BLD 10FT (ELECTRODE) ×3 IMPLANT
SLEEVE SCD COMPRESS KNEE MED (MISCELLANEOUS) ×3 IMPLANT
SPONGE LAP 4X18 X RAY DECT (DISPOSABLE) ×3 IMPLANT
SUT MNCRL AB 4-0 PS2 18 (SUTURE) ×3 IMPLANT
SUT SILK 2 0 SH (SUTURE) IMPLANT
SUT VICRYL 3-0 CR8 SH (SUTURE) ×3 IMPLANT
SYR CONTROL 10ML LL (SYRINGE) ×3 IMPLANT
TOWEL OR 17X24 6PK STRL BLUE (TOWEL DISPOSABLE) ×3 IMPLANT
TOWEL OR NON WOVEN STRL DISP B (DISPOSABLE) ×3 IMPLANT
TUBE CONNECTING 20'X1/4 (TUBING)
TUBE CONNECTING 20X1/4 (TUBING) IMPLANT
YANKAUER SUCT BULB TIP NO VENT (SUCTIONS) IMPLANT

## 2015-09-23 NOTE — Interval H&P Note (Signed)
History and Physical Interval Note:  09/23/2015 9:49 AM  Ruth Soto  has presented today for surgery, with the diagnosis of Papilloma Left Breast  The various methods of treatment have been discussed with the patient and family. After consideration of risks, benefits and other options for treatment, the patient has consented to  Procedure(s): LEFT BREAST LUMPECTOMY WITH RADIOACTIVE SEED LOCALIZATION (Left) as a surgical intervention .  The patient's history has been reviewed, patient examined, no change in status, stable for surgery.  I have reviewed the patient's chart and labs.  Questions were answered to the patient's satisfaction.     Tyrian Peart A.

## 2015-09-23 NOTE — H&P (Signed)
H&P   LORISSA KISHBAUGH (MR# [id number])      H&P Info    Author Note Status Last Update User Last Update Date/Time   Erroll Luna, MD Signed Erroll Luna, MD 04/26/2015 12:56 PM    H&P    Expand All Collapse All   risty Sermons 04/26/2015 11:46 AM Location: [facility] Patient #: [id number] DOB: 1966/02/21 Married / Language: Cleophus Molt / Race: White Female History of Present Illness Marcello Moores A. Tradarius Reinwald MD; 04/26/2015 12:54 PM) Patient words: check breast lump Pt sent at the request of Dr Shelly Bombard for left breast papilloma and calcifications on mammogram. No hx of mass, pain or discharge. Had a milk duct excisied many rears ago.       CLINICAL DATA: Followup of calcifications in the left breast. Patient has a history of and the prior surgical excision of the left breast, per the patient in 2001. She states she had a milk duct removed at that time. EXAM: DIGITAL DIAGNOSTIC LEFT MAMMOGRAM WITH 3D TOMOSYNTHESIS AND CAD COMPARISON: 09/18/2014, 09/01/2014 ACR Breast Density Category: B. There are scattered fibroglandular densities. FINDINGS: No distortion is identified in the left breast. There is a is tubular density in the anterior third of the outer left breast slightly superior to the level of the nipple that is associated with a 12 x 15 x 13 mm group of coarse, heterogeneous calcifications. Some of the calcifications are linearly oriented. Mammographic images were processed with CAD. IMPRESSION: Group of heterogeneous calcifications with associated density in the anterior third of the left breast. Stereotactic biopsy is suggested. RECOMMENDATION: Stereotactic biopsy of left breast calcifications is recommended. This was scheduled for the patient, for next Thursday, April 22, 2015. I have discussed the findings and recommendations with the patient. Results were also provided in writing at the conclusion of the visit. If applicable, a reminder letter will be sent to the  patient regarding the next appointment. BI-RADS CATEGORY 4: Suspicious. Electronically Signed By: Curlene Dolphin M.D. On: 04/15/2015 14:28   CLINICAL DATA: Post biopsy of calcifications in the upper-outer left breast.  EXAM: DIAGNOSTIC LEFT MAMMOGRAM POST STEREOTACTIC BIOPSY  COMPARISON: Previous exam(s).  FINDINGS: Mammographic images were obtained following stereotactic guided biopsy of a loose group of calcifications in the upper-outer left breast anterior depth. A coil shaped biopsy marking clip is present in the targeted region of the left breast calcifications.  IMPRESSION: Appropriate positioning of coil shaped biopsy marking clip post biopsy of calcifications in the upper-outer left breast.  Final Assessment: Post Procedure Mammograms for Marker Placement   Electronically Signed By: Everlean Alstrom M.D. On: 04/22/2015 09:50         Breast, left, needle core biopsy, upper outer - SCLEROSED INTRADUCTAL PAPILLOMA WITH CALCIFICATIONS. - SEE COMMENT. Microscopic Comment The results were called to [facility] on 04/23/15. (JBK:gt, 04/23/15).  The patient is a 49 year old female   Other Problems Elbert Ewings, CMA; 04/26/2015 11:47 AM) Lump In Breast Thyroid Disease  Past Surgical History Elbert Ewings, CMA; 04/26/2015 11:47 AM) Breast Biopsy Left. Cesarean Section - 1 Oral Surgery Thyroid Surgery Tonsillectomy  Diagnostic Studies History Elbert Ewings, CMA; 04/26/2015 11:47 AM) Colonoscopy 5-10 years ago Mammogram within last year Pap Smear 1-5 years ago  Allergies Elbert Ewings, CMA; 04/26/2015 11:47 AM) No Known Drug Allergies 04/26/2015  Medication History Elbert Ewings, CMA; 04/26/2015 11:47 AM) Levothyroxine Sodium (50MCG Tablet, Oral) Active. Medications Reconciled  Social History Elbert Ewings, [place]; 04/26/2015 11:47 AM) Alcohol use Moderate alcohol use. Caffeine  use Carbonated beverages, Coffee, Tea. No drug  use Tobacco use Current some day smoker.  Family History Elbert Ewings, [place]; 04/26/2015 11:47 AM) Arthritis Mother. Colon Cancer Brother. Seizure disorder Brother. Thyroid problems Mother.  Pregnancy / Birth History Elbert Ewings, CMA; 04/26/2015 11:47 AM) Age at menarche 74 years. Contraceptive History Intrauterine device. Gravida 1 Irregular periods Maternal age 21-25 Para 1     Review of Systems Elbert Ewings CMA; 04/26/2015 11:47 AM) General Not Present- Appetite Loss, Chills, Fatigue, Fever, Night Sweats, Weight Gain and Weight Loss. Skin Not Present- Change in Wart/Mole, Dryness, Hives, Jaundice, New Lesions, Non-Healing Wounds, Rash and Ulcer. HEENT Not Present- Earache, Hearing Loss, Hoarseness, Nose Bleed, Oral Ulcers, Ringing in the Ears, Seasonal Allergies, Sinus Pain, Sore Throat, Visual Disturbances, Wears glasses/contact lenses and Yellow Eyes. Respiratory Not Present- Bloody sputum, Chronic Cough, Difficulty Breathing, Snoring and Wheezing. Breast Present- Breast Mass. Not Present- Breast Pain, Nipple Discharge and Skin Changes. Cardiovascular Not Present- Chest Pain, Difficulty Breathing Lying Down, Leg Cramps, Palpitations, Rapid Heart Rate, Shortness of Breath and Swelling of Extremities. Gastrointestinal Not Present- Abdominal Pain, Bloating, Bloody Stool, Change in Bowel Habits, Chronic diarrhea, Constipation, Difficulty Swallowing, Excessive gas, Gets full quickly at meals, Hemorrhoids, Indigestion, Nausea, Rectal Pain and Vomiting. Female Genitourinary Not Present- Frequency, Nocturia, Painful Urination, Pelvic Pain and Urgency. Musculoskeletal Not Present- Back Pain, Joint Pain, Joint Stiffness, Muscle Pain, Muscle Weakness and Swelling of Extremities. Neurological Not Present- Decreased Memory, Fainting, Headaches, Numbness, Seizures, Tingling, Tremor, Trouble walking and Weakness. Psychiatric Not Present- Anxiety, Bipolar, Change in Sleep Pattern,  Depression, Fearful and Frequent crying. Endocrine Not Present- Cold Intolerance, Excessive Hunger, Hair Changes, Heat Intolerance, Hot flashes and New Diabetes. Hematology Not Present- Easy Bruising, Excessive bleeding, Gland problems, HIV and Persistent Infections.  Vitals Elbert Ewings CMA; 04/26/2015 11:48 AM) 04/26/2015 11:47 AM Weight: 207 lb Height: 67in Body Surface Area: 2.11 m Body Mass Index: 32.42 kg/m Temp.: 99.78F(Oral)  Pulse: 93 (Irregular)  Resp.: 17 (Unlabored)  BP: 128/78 (Sitting, Left Arm, Standard) patient had a cup of hot tea before temp. was taken.    Physical Exam (Leonore Frankson A. Bodee Lafoe MD; 04/26/2015 12:54 PM)  General Mental Status-Alert. General Appearance-Consistent with stated age. Hydration-Well hydrated. Voice-Normal.  Head and Neck Head-normocephalic, atraumatic with no lesions or palpable masses. Trachea-midline. Thyroid Gland Characteristics - normal size and consistency.  Eye Eyeball - Bilateral-Extraocular movements intact. Sclera/Conjunctiva - Bilateral-No scleral icterus.  Chest and Lung Exam Chest and lung exam reveals -quiet, even and easy respiratory effort with no use of accessory muscles and on auscultation, normal breath sounds, no adventitious sounds and normal vocal resonance. Inspection Chest Wall - Normal. Back - normal.  Breast Breast - Left-Symmetric, Non Tender, No Biopsy scars, no Dimpling, No Inflammation, No Lumpectomy scars, No Mastectomy scars, No Peau d' Orange. Breast - Right-Symmetric, Non Tender, No Biopsy scars, no Dimpling, No Inflammation, No Lumpectomy scars, No Mastectomy scars, No Peau d' Orange. Breast Lump-No Palpable Breast Mass. Note: bruising left breast   Cardiovascular Cardiovascular examination reveals -normal heart sounds, regular rate and rhythm with no murmurs and normal pedal pulses bilaterally.  Abdomen Inspection Inspection of the abdomen reveals - No  Hernias. Skin - Scar - no surgical scars. Palpation/Percussion Palpation and Percussion of the abdomen reveal - Soft, Non Tender, No Rebound tenderness, No Rigidity (guarding) and No hepatosplenomegaly. Auscultation Auscultation of the abdomen reveals - Bowel sounds normal.  Neurologic Neurologic evaluation reveals -alert and oriented x 3 with no impairment of recent or remote  memory. Mental Status-Normal.  Musculoskeletal Normal Exam - Left-Upper Extremity Strength Normal and Lower Extremity Strength Normal. Normal Exam - Right-Upper Extremity Strength Normal and Lower Extremity Strength Normal.  Lymphatic Head & Neck  General Head & Neck Lymphatics: Bilateral - Description - Normal. Axillary  General Axillary Region: Bilateral - Description - Normal. Tenderness - Non Tender. Femoral & Inguinal  Generalized Femoral & Inguinal Lymphatics: Bilateral - Description - Normal. Tenderness - Non Tender.    Assessment & Plan (Vivan Vanderveer A. 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 Risk of ocult malignancyaround 3 %. She wishes to proceed with left breast seed localized lumpectomy. Risk of lumpectomy include bleeding, infection, seroma, more surgery, use of seed/wire, wound care, cosmetic deformity and the need for other treatments, death , blood clots, death. Pt agrees to proceed.  Current Plans Pt Education - Benign Tumors: discussed with patient and provided information. Pt Education - Breast Diseases: discussed with patient and provided information. Pt Education - [facility] Breast Biopsy HCI

## 2015-09-23 NOTE — Op Note (Signed)
Preoperative diagnosis: Left breast papilloma  Postoperative diagnosis: Same   Procedure: Left breast seed localized lumpectomy  Surgeon: Erroll Luna M.D.  Anesthesia: Gen. With 0.25% Sensorcaine local  EBL: 20 cc  Specimen: Left breast tissue with clip and radioactive seed in the specimen. Verified with neoprobe and radiographic image showing both seed and clip in specimen  Indications for procedure: The patient presents for left breast  lumpectomy after core biopsy showed papilloma. Discussed the rationale for considering excision. Small risk of malignancy associated with papilloma lesion after core biopsy. Discussed observation. Discussed wire/ seed  localization. Patient desired excision of left breast papilloma.The procedure has been discussed with the patient. Alternatives to surgery have been discussed with the patient.  Risks of surgery include bleeding,  Infection,  Seroma formation, cosmetic deformity,   death,  and the need for further surgery.   The patient understands and wishes to proceed.   Description of procedure: Patient underwent seed placement as an outpatient. Patient presents today for left breast seed localized lumpectomy. Patient seen in holding area. Questions are answered and neoprobe used to verify seed location. Patient taken back to the operating room and placed supine  upon the OR table. After induction of general anesthesia, left breast prepped and draped in a sterile fashion. Timeout was done to verify proper  procedure. Neoprobe used and hot spot identified and left breast upper-outer quadrant. This was marked with pen. Curvilinear incision made along lateral border of the NAC. Marland Kitchen Dissection used with the help of a neoprobe around the tissue where the seed and clip were located. Tissue removed in its entirety with gross  Negative margins.Marland Kitchen Neoprobe used and seed within specimen with clip. . Radiographs taken which show clip and seed  In specimen.Hemostasis achieved  and cavity closed with 3-0 Vicryl and 4-0 Monocryl. Liquid adhesive applied. All final counts found to be correct. Specimen transported to pathology with seed and clip. Patient awoke extubated taken to recovery in satisfactory condition.

## 2015-09-23 NOTE — H&P (Signed)
H&P   KARYL SHARRAR (MR# [id number])      H&P Info    Chief Strategy Officer Note Status Last Update User Last Update Date/Time   Erroll Luna, MD Signed Erroll Luna, MD     H&P    Expand All Collapse All   risty Smitherman  Location: [facility] Patient #: [id number] DOB: 10/21/1966 Married / Language: English / Race: White Female History of Present Illness  Patient words: check breast lump Pt sent at the request of Dr Shelly Bombard for left breast papilloma and calcifications on mammogram. No hx of mass, pain or discharge. Had a milk duct excisied many rears ago.       CLINICAL DATA: Followup of calcifications in the left breast. Patient has a history of and the prior surgical excision of the left breast, per the patient in 2001. She states she had a milk duct removed at that time. EXAM: DIGITAL DIAGNOSTIC LEFT MAMMOGRAM WITH 3D TOMOSYNTHESIS AND CAD COMPARISON: 09/18/2014, 09/01/2014 ACR Breast Density Category: B. There are scattered fibroglandular densities. FINDINGS: No distortion is identified in the left breast. There is a is tubular density in the anterior third of the outer left breast slightly superior to the level of the nipple that is associated with a 12 x 15 x 13 mm group of coarse, heterogeneous calcifications. Some of the calcifications are linearly oriented. Mammographic images were processed with CAD. IMPRESSION: Group of heterogeneous calcifications with associated density in the anterior third of the left breast. Stereotactic biopsy is suggested. RECOMMENDATION: Stereotactic biopsy of left breast calcifications is recommended. This was scheduled for the patient, for next Thursday, April 22, 2015. I have discussed the findings and recommendations with the patient. Results were also provided in writing at the conclusion of the visit. If applicable, a reminder letter will be sent to the patient regarding the next appointment. BI-RADS CATEGORY 4: Suspicious.  Electronically Signed By: Curlene Dolphin M.D. On: 04/15/2015 14:28   CLINICAL DATA: Post biopsy of calcifications in the upper-outer left breast.  EXAM: DIAGNOSTIC LEFT MAMMOGRAM POST STEREOTACTIC BIOPSY  COMPARISON: Previous exam(s).  FINDINGS: Mammographic images were obtained following stereotactic guided biopsy of a loose group of calcifications in the upper-outer left breast anterior depth. A coil shaped biopsy marking clip is present in the targeted region of the left breast calcifications.  IMPRESSION: Appropriate positioning of coil shaped biopsy marking clip post biopsy of calcifications in the upper-outer left breast.  Final Assessment: Post Procedure Mammograms for Marker Placement   Electronically Signed By: Everlean Alstrom M.D. On: 04/22/2015 09:50         Breast, left, needle core biopsy, upper outer - SCLEROSED INTRADUCTAL PAPILLOMA WITH CALCIFICATIONS. - SEE COMMENT. Microscopic Comment The results were called to [facility] on 04/23/15. (JBK:gt, 04/23/15).  The patient is a 49 year old female   Other Problems  Lump In Breast Thyroid Disease  Past Surgical History Elbert Ewings, CMA;  Breast Biopsy Left. Cesarean Section - 1 Oral Surgery Thyroid Surgery Tonsillectomy  Diagnostic Studies History Elbert Ewings, CMA;  Colonoscopy 5-10 years ago Mammogram within last year Pap Smear 1-5 years ago  Allergies Elbert Ewings, [place];  No Known Drug Allergies 04/26/2015  Medication History Elbert Ewings, CMA; Levothyroxine Sodium (50MCG Tablet, Oral) Active. Medications Reconciled  Social History Elbert Ewings, [place];  Alcohol use Moderate alcohol use. Caffeine use Carbonated beverages, Coffee, Tea. No drug use Tobacco use Current some day smoker.  Family History Elbert Ewings, [place];  Arthritis Mother. Colon Cancer Brother.  Seizure disorder Brother. Thyroid problems Mother.  Pregnancy / Birth  History Elbert Ewings,  Age at menarche 81 years. Contraceptive History Intrauterine device. Gravida 1 Irregular periods Maternal age 31-25 Para 1     Review of Systems ([place] Not Present- Appetite Loss, Chills, Fatigue, Fever, Night Sweats, Weight Gain and Weight Loss. Skin Not Present- Change in Wart/Mole, Dryness, Hives, Jaundice, New Lesions, Non-Healing Wounds, Rash and Ulcer. HEENT Not Present- Earache, Hearing Loss, Hoarseness, Nose Bleed, Oral Ulcers, Ringing in the Ears, Seasonal Allergies, Sinus Pain, Sore Throat, Visual Disturbances, Wears glasses/contact lenses and Yellow Eyes. Respiratory Not Present- Bloody sputum, Chronic Cough, Difficulty Breathing, Snoring and Wheezing. Breast Present- Breast Mass. Not Present- Breast Pain, Nipple Discharge and Skin Changes. Cardiovascular Not Present- Chest Pain, Difficulty Breathing Lying Down, Leg Cramps, Palpitations, Rapid Heart Rate, Shortness of Breath and Swelling of Extremities. Gastrointestinal Not Present- Abdominal Pain, Bloating, Bloody Stool, Change in Bowel Habits, Chronic diarrhea, Constipation, Difficulty Swallowing, Excessive gas, Gets full quickly at meals, Hemorrhoids, Indigestion, Nausea, Rectal Pain and Vomiting. Female Genitourinary Not Present- Frequency, Nocturia, Painful Urination, Pelvic Pain and Urgency. Musculoskeletal Not Present- Back Pain, Joint Pain, Joint Stiffness, Muscle Pain, Muscle Weakness and Swelling of Extremities. Neurological Not Present- Decreased Memory, Fainting, Headaches, Numbness, Seizures, Tingling, Tremor, Trouble walking and Weakness. Psychiatric Not Present- Anxiety, Bipolar, Change in Sleep Pattern, Depression, Fearful and Frequent crying. Endocrine Not Present- Cold Intolerance, Excessive Hunger, Hair Changes, Heat Intolerance, Hot flashes and New Diabetes. Hematology Not Present- Easy Bruising, Excessive bleeding, Gland problems, HIV and Persistent  Infections.  Vitals Elbert Ewings CMA; 04/26/2015 11:47 AM Weight: 207 lb Height: 67in Body Surface Area: 2.11 m Body Mass Index: 32.42 kg/m Temp.: 99.51F(Oral)  Pulse: 93 (Irregular)  Resp.: 17 (Unlabored)  BP: 128/78 (Sitting, Left Arm, Standard) patient had a cup of hot tea before temp. was taken.    Physical Exam   General Mental Status-Alert. General Appearance-Consistent with stated age. Hydration-Well hydrated. Voice-Normal.  Head and Neck Head-normocephalic, atraumatic with no lesions or palpable masses. Trachea-midline. Thyroid Gland Characteristics - normal size and consistency.  Eye Eyeball - Bilateral-Extraocular movements intact. Sclera/Conjunctiva - Bilateral-No scleral icterus.  Chest and Lung Exam Chest and lung exam reveals -quiet, even and easy respiratory effort with no use of accessory muscles and on auscultation, normal breath sounds, no adventitious sounds and normal vocal resonance. Inspection Chest Wall - Normal. Back - normal.  Breast Breast - Left-Symmetric, Non Tender, No Biopsy scars, no Dimpling, No Inflammation, No Lumpectomy scars, No Mastectomy scars, No Peau d' Orange. Breast - Right-Symmetric, Non Tender, No Biopsy scars, no Dimpling, No Inflammation, No Lumpectomy scars, No Mastectomy scars, No Peau d' Orange. Breast Lump-No Palpable Breast Mass. Note: bruising left breast   Cardiovascular Cardiovascular examination reveals -normal heart sounds, regular rate and rhythm with no murmurs and normal pedal pulses bilaterally.  Abdomen Inspection Inspection of the abdomen reveals - No Hernias. Skin - Scar - no surgical scars. Palpation/Percussion Palpation and Percussion of the abdomen reveal - Soft, Non Tender, No Rebound tenderness, No Rigidity (guarding) and No hepatosplenomegaly. Auscultation Auscultation of the abdomen reveals - Bowel sounds normal.  Neurologic Neurologic evaluation  reveals -alert and oriented x 3 with no impairment of recent or remote memory. Mental Status-Normal.  Musculoskeletal Normal Exam - Left-Upper Extremity Strength Normal and Lower Extremity Strength Normal. Normal Exam - Right-Upper Extremity Strength Normal and Lower Extremity Strength Normal.  Lymphatic Head & Neck  General Head & Neck Lymphatics: Bilateral - Description -  Normal. Axillary  General Axillary Region: Bilateral - Description - Normal. Tenderness - Non Tender. Femoral & Inguinal  Generalized Femoral & Inguinal Lymphatics: Bilateral - Description - Normal. Tenderness - Non Tender.    Assessment & Plan (Lazer Wollard A. 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 Risk of ocult malignancyaround 3 %. She wishes to proceed with left breast seed localized lumpectomy. Risk of lumpectomy include bleeding, infection, seroma, more surgery, use of seed/wire, wound care, cosmetic deformity and the need for other treatments, death , blood clots, death. Pt agrees to proceed.  Current Plans Pt Education - Benign Tumors: discussed with patient and provided information. Pt Education - Breast Diseases: discussed with patient and provided information. Pt Education - [facility] Breast Biopsy [facility]CI

## 2015-09-23 NOTE — Anesthesia Postprocedure Evaluation (Signed)
  Anesthesia Post-op Note  Patient: Ruth Soto  Procedure(s) Performed: Procedure(s): LEFT BREAST LUMPECTOMY WITH RADIOACTIVE SEED LOCALIZATION (Left)  Patient Location: PACU  Anesthesia Type: General   Level of Consciousness: awake, alert  and oriented  Airway and Oxygen Therapy: Patient Spontanous Breathing  Post-op Pain: mild  Post-op Assessment: Post-op Vital signs reviewed  Post-op Vital Signs: Reviewed  Last Vitals:  Filed Vitals:   09/23/15 1217  BP: 144/88  Pulse: 81  Temp:   Resp: 16    Complications: No apparent anesthesia complications

## 2015-09-23 NOTE — Transfer of Care (Signed)
Immediate Anesthesia Transfer of Care Note  Patient: Ruth Soto  Procedure(s) Performed: Procedure(s): LEFT BREAST LUMPECTOMY WITH RADIOACTIVE SEED LOCALIZATION (Left)  Patient Location: PACU  Anesthesia Type:General  Level of Consciousness: awake  Airway & Oxygen Therapy: Patient Spontanous Breathing and Patient connected to face mask oxygen  Post-op Assessment: Report given to RN and Post -op Vital signs reviewed and stable  Post vital signs: Reviewed and stable  Last Vitals:  Filed Vitals:   09/23/15 0915  BP: 137/80  Pulse: 87  Temp: 36.9 C  Resp: 18    Complications: No apparent anesthesia complications

## 2015-09-23 NOTE — Anesthesia Procedure Notes (Signed)
Procedure Name: LMA Insertion Date/Time: 09/23/2015 10:11 AM Performed by: Lieutenant Diego Pre-anesthesia Checklist: Patient identified, Emergency Drugs available, Suction available and Patient being monitored Patient Re-evaluated:Patient Re-evaluated prior to inductionOxygen Delivery Method: Circle System Utilized Preoxygenation: Pre-oxygenation with 100% oxygen Intubation Type: IV induction Ventilation: Mask ventilation without difficulty LMA: LMA inserted LMA Size: 4.0 Number of attempts: 1 Airway Equipment and Method: Bite block Placement Confirmation: positive ETCO2 and breath sounds checked- equal and bilateral Tube secured with: Tape Dental Injury: Teeth and Oropharynx as per pre-operative assessment

## 2015-09-23 NOTE — Anesthesia Preprocedure Evaluation (Signed)
Anesthesia Evaluation  Patient identified by MRN, date of birth, ID band Patient awake    Reviewed: Allergy & Precautions, NPO status , Patient's Chart, lab work & pertinent test results  Airway Mallampati: I  TM Distance: >3 FB Neck ROM: Full    Dental  (+) Teeth Intact, Dental Advisory Given   Pulmonary    breath sounds clear to auscultation       Cardiovascular  Rhythm:Regular Rate:Normal     Neuro/Psych    GI/Hepatic   Endo/Other  Hypothyroidism   Renal/GU      Musculoskeletal   Abdominal   Peds  Hematology   Anesthesia Other Findings   Reproductive/Obstetrics                             Anesthesia Physical Anesthesia Plan  ASA: I  Anesthesia Plan: General   Post-op Pain Management:    Induction: Intravenous  Airway Management Planned: LMA  Additional Equipment:   Intra-op Plan:   Post-operative Plan: Extubation in OR  Informed Consent: I have reviewed the patients History and Physical, chart, labs and discussed the procedure including the risks, benefits and alternatives for the proposed anesthesia with the patient or authorized representative who has indicated his/her understanding and acceptance.   Dental advisory given  Plan Discussed with: CRNA, Anesthesiologist and Surgeon  Anesthesia Plan Comments:         Anesthesia Quick Evaluation

## 2015-09-23 NOTE — Discharge Instructions (Signed)
[facility] °Office Phone Number [phone] ° °BREAST BIOPSY/ PARTIAL MASTECTOMY: POST OP INSTRUCTIONS ° °Always review your discharge instruction sheet given to you by the facility where your surgery was performed. ° °IF YOU HAVE DISABILITY OR FAMILY LEAVE FORMS, YOU MUST BRING THEM TO THE OFFICE FOR PROCESSING.  DO NOT GIVE THEM TO YOUR DOCTOR. ° °1. A prescription for pain medication may be given to you upon discharge.  Take your pain medication as prescribed, if needed.  If narcotic pain medicine is not needed, then you may take acetaminophen (Tylenol) or ibuprofen (Advil) as needed. °2. Take your usually prescribed medications unless otherwise directed °3. If you need a refill on your pain medication, please contact your pharmacy.  They will contact our office to request authorization.  Prescriptions will not be filled after 5pm or on week-ends. °4. You should eat very light the first 24 hours after surgery, such as soup, crackers, pudding, etc.  Resume your normal diet the day after surgery. °5. Most patients will experience some swelling and bruising in the breast.  Ice packs and a good support bra will help.  Swelling and bruising can take several days to resolve.  °6. It is common to experience some constipation if taking pain medication after surgery.  Increasing fluid intake and taking a stool softener will usually help or prevent this problem from occurring.  A mild laxative (Milk of Magnesia or Miralax) should be taken according to package directions if there are no bowel movements after 48 hours. °7. Unless discharge instructions indicate otherwise, you may remove your bandages 24-48 hours after surgery, and you may shower at that time.  You may have steri-strips (small skin tapes) in place directly over the incision.  These strips should be left on the skin for 7-10 days.  If your surgeon used skin glue on the incision, you may shower in 24 hours.  The glue will flake off over the  next 2-3 weeks.  Any sutures or staples will be removed at the office during your follow-up visit. °8. ACTIVITIES:  You may resume regular daily activities (gradually increasing) beginning the next day.  Wearing a good support bra or sports bra minimizes pain and swelling.  You may have sexual intercourse when it is comfortable. °a. You may drive when you no longer are taking prescription pain medication, you can comfortably wear a seatbelt, and you can safely maneuver your car and apply brakes. °b. RETURN TO WORK:  ______________________________________________________________________________________ °9. You should see your doctor in the office for a follow-up appointment approximately two weeks after your surgery.  Your doctor’s nurse will typically make your follow-up appointment when she calls you with your pathology report.  Expect your pathology report 2-3 business days after your surgery.  You may call to check if you do not hear from us after three days. °10. OTHER INSTRUCTIONS: _______________________________________________________________________________________________ _____________________________________________________________________________________________________________________________________ °_____________________________________________________________________________________________________________________________________ °_____________________________________________________________________________________________________________________________________ ° °WHEN TO CALL YOUR DOCTOR: °1. Fever over 101.0 °2. Nausea and/or vomiting. °3. Extreme swelling or bruising. °4. Continued bleeding from incision. °5. Increased pain, redness, or drainage from the incision. ° °The clinic staff is available to answer your questions during regular business hours.  Please don’t hesitate to call and ask to speak to one of the nurses for clinical concerns.  If you have a medical emergency, go to the nearest  emergency room or call 911.  A surgeon from [facility] is always on call at the hospital. ° °For further questions, please visit [email or web address]  ° ° ° °  Post Anesthesia Home Care Instructions ° °Activity: °Get plenty of rest for the remainder of the day. A responsible adult should stay with you for 24 hours following the procedure.  °For the next 24 hours, DO NOT: °-Drive a car °-Operate machinery °-Drink alcoholic beverages °-Take any medication unless instructed by your physician °-Make any legal decisions or sign important papers. ° °Meals: °Start with liquid foods such as gelatin or soup. Progress to regular foods as tolerated. Avoid greasy, spicy, heavy foods. If nausea and/or vomiting occur, drink only clear liquids until the nausea and/or vomiting subsides. Call your physician if vomiting continues. ° °Special Instructions/Symptoms: °Your throat may feel dry or sore from the anesthesia or the breathing tube placed in your throat during surgery. If this causes discomfort, gargle with warm salt water. The discomfort should disappear within 24 hours. ° °If you had a scopolamine patch placed behind your ear for the management of post- operative nausea and/or vomiting: ° °1. The medication in the patch is effective for 72 hours, after which it should be removed.  Wrap patch in a tissue and discard in the trash. Wash hands thoroughly with soap and water. °2. You may remove the patch earlier than 72 hours if you experience unpleasant side effects which may include dry mouth, dizziness or visual disturbances. °3. Avoid touching the patch. Wash your hands with soap and water after contact with the patch. °  ° °

## 2015-09-24 ENCOUNTER — Encounter (HOSPITAL_BASED_OUTPATIENT_CLINIC_OR_DEPARTMENT_OTHER): Payer: Self-pay | Admitting: Surgery

## 2016-07-26 ENCOUNTER — Other Ambulatory Visit: Payer: BLUE CROSS/BLUE SHIELD

## 2017-07-04 ENCOUNTER — Other Ambulatory Visit: Payer: Self-pay | Admitting: Endocrinology

## 2017-07-04 DIAGNOSIS — E049 Nontoxic goiter, unspecified: Secondary | ICD-10-CM

## 2017-07-30 ENCOUNTER — Inpatient Hospital Stay
Admission: RE | Admit: 2017-07-30 | Discharge: 2017-07-30 | Disposition: A | Discharge status: Home / Self Care | Payer: BLUE CROSS/BLUE SHIELD | Source: Ambulatory Visit | Attending: Endocrinology | Admitting: Endocrinology

## 2017-11-09 ENCOUNTER — Other Ambulatory Visit: Payer: Self-pay | Admitting: Endocrinology

## 2017-11-09 DIAGNOSIS — E049 Nontoxic goiter, unspecified: Secondary | ICD-10-CM

## 2017-11-19 ENCOUNTER — Other Ambulatory Visit: Payer: BLUE CROSS/BLUE SHIELD

## 2018-01-07 ENCOUNTER — Ambulatory Visit
Admission: RE | Admit: 2018-01-07 | Discharge: 2018-01-07 | Disposition: A | Discharge status: Home / Self Care | Payer: BLUE CROSS/BLUE SHIELD | Source: Ambulatory Visit | Attending: Endocrinology | Admitting: Endocrinology

## 2018-01-07 DIAGNOSIS — E049 Nontoxic goiter, unspecified: Secondary | ICD-10-CM

## 2018-01-17 ENCOUNTER — Other Ambulatory Visit: Payer: Self-pay | Admitting: Endocrinology

## 2018-01-17 DIAGNOSIS — E049 Nontoxic goiter, unspecified: Secondary | ICD-10-CM

## 2018-01-29 ENCOUNTER — Other Ambulatory Visit: Payer: Self-pay | Admitting: Endocrinology

## 2018-01-29 DIAGNOSIS — E049 Nontoxic goiter, unspecified: Secondary | ICD-10-CM

## 2018-01-30 ENCOUNTER — Other Ambulatory Visit: Payer: BLUE CROSS/BLUE SHIELD

## 2018-02-06 ENCOUNTER — Other Ambulatory Visit (HOSPITAL_COMMUNITY)
Admission: RE | Admit: 2018-02-06 | Discharge: 2018-02-06 | Disposition: A | Discharge status: Home / Self Care | Payer: BLUE CROSS/BLUE SHIELD | Source: Ambulatory Visit | Attending: Student | Admitting: Student

## 2018-02-06 ENCOUNTER — Ambulatory Visit
Admission: RE | Admit: 2018-02-06 | Discharge: 2018-02-06 | Disposition: A | Discharge status: Home / Self Care | Payer: BLUE CROSS/BLUE SHIELD | Source: Ambulatory Visit | Attending: Endocrinology | Admitting: Endocrinology

## 2018-02-06 DIAGNOSIS — E041 Nontoxic single thyroid nodule: Secondary | ICD-10-CM | POA: Diagnosis present

## 2018-02-06 DIAGNOSIS — E049 Nontoxic goiter, unspecified: Secondary | ICD-10-CM

## 2018-06-20 DIAGNOSIS — R0789 Other chest pain: Secondary | ICD-10-CM | POA: Diagnosis not present

## 2018-07-26 DIAGNOSIS — Z01419 Encounter for gynecological examination (general) (routine) without abnormal findings: Secondary | ICD-10-CM | POA: Diagnosis not present

## 2018-07-26 DIAGNOSIS — Z6830 Body mass index (BMI) 30.0-30.9, adult: Secondary | ICD-10-CM | POA: Diagnosis not present

## 2018-07-26 DIAGNOSIS — Z1231 Encounter for screening mammogram for malignant neoplasm of breast: Secondary | ICD-10-CM | POA: Diagnosis not present

## 2018-07-26 DIAGNOSIS — Z118 Encounter for screening for other infectious and parasitic diseases: Secondary | ICD-10-CM | POA: Diagnosis not present

## 2018-09-18 DIAGNOSIS — Z87891 Personal history of nicotine dependence: Secondary | ICD-10-CM | POA: Diagnosis not present

## 2018-09-18 DIAGNOSIS — R0789 Other chest pain: Secondary | ICD-10-CM | POA: Diagnosis not present

## 2018-09-18 DIAGNOSIS — R002 Palpitations: Secondary | ICD-10-CM | POA: Diagnosis not present

## 2018-09-18 DIAGNOSIS — I10 Essential (primary) hypertension: Secondary | ICD-10-CM | POA: Diagnosis not present

## 2018-09-20 DIAGNOSIS — I440 Atrioventricular block, first degree: Secondary | ICD-10-CM | POA: Diagnosis not present

## 2018-09-26 DIAGNOSIS — R030 Elevated blood-pressure reading, without diagnosis of hypertension: Secondary | ICD-10-CM | POA: Diagnosis not present

## 2018-09-26 DIAGNOSIS — Z87891 Personal history of nicotine dependence: Secondary | ICD-10-CM | POA: Diagnosis not present

## 2018-09-26 DIAGNOSIS — R002 Palpitations: Secondary | ICD-10-CM | POA: Diagnosis not present

## 2018-09-26 DIAGNOSIS — R0789 Other chest pain: Secondary | ICD-10-CM | POA: Diagnosis not present

## 2018-10-15 DIAGNOSIS — R002 Palpitations: Secondary | ICD-10-CM | POA: Diagnosis not present

## 2018-10-15 DIAGNOSIS — I493 Ventricular premature depolarization: Secondary | ICD-10-CM | POA: Diagnosis not present

## 2018-11-01 IMAGING — US US THYROID
1 series · 13 of 25 positions shown · non-contrast
Comparison: 08/10/2014

CLINICAL DATA: Goiter.  Left lobectomy.

EXAM:
THYROID ULTRASOUND
TECHNIQUE: Ultrasound examination of the thyroid gland and adjacent soft
tissues was performed.

[Series 1: us thyroid · 0.08mm/px · 13 of 72 slices shown]
[im 1/72]
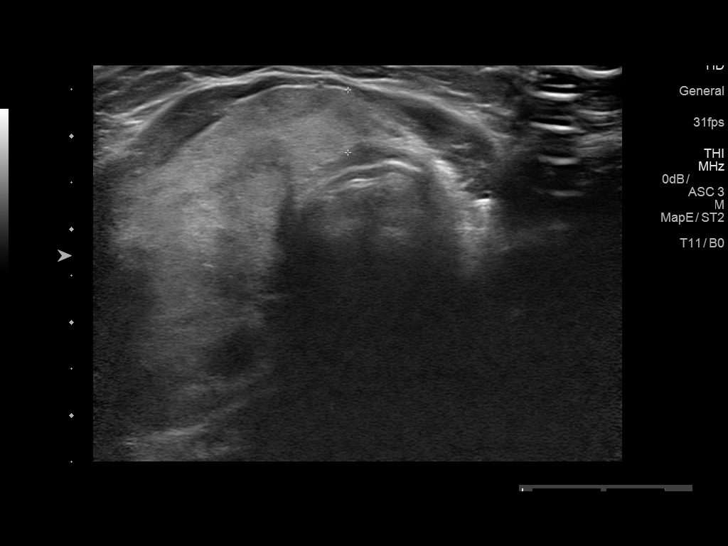
[im 6/72]
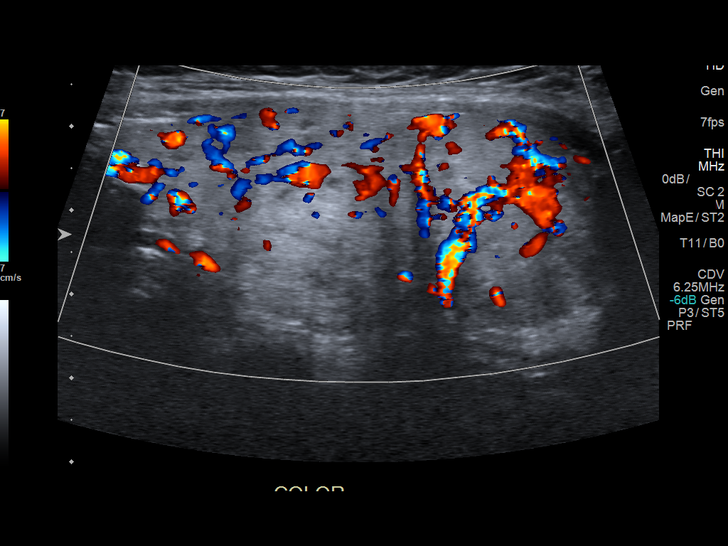
[im 12/72]
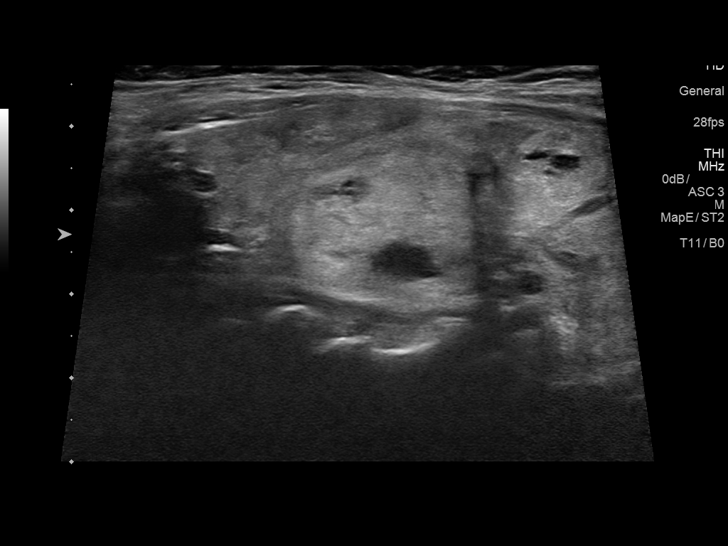
[im 18/72]
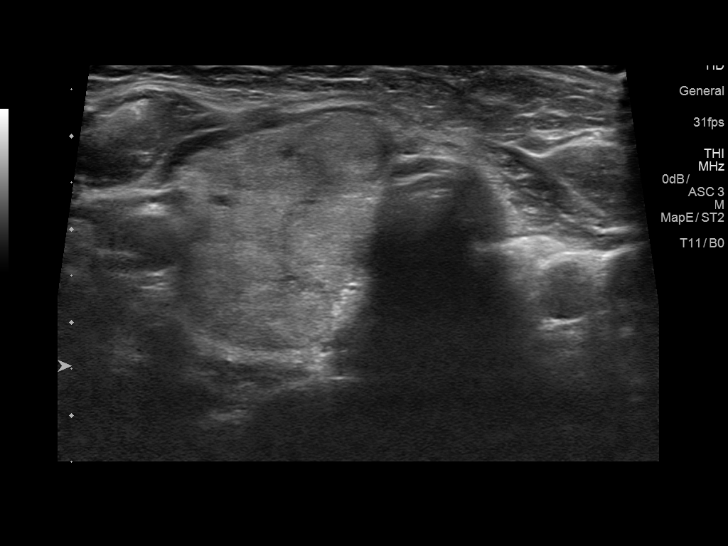
[im 24/72]
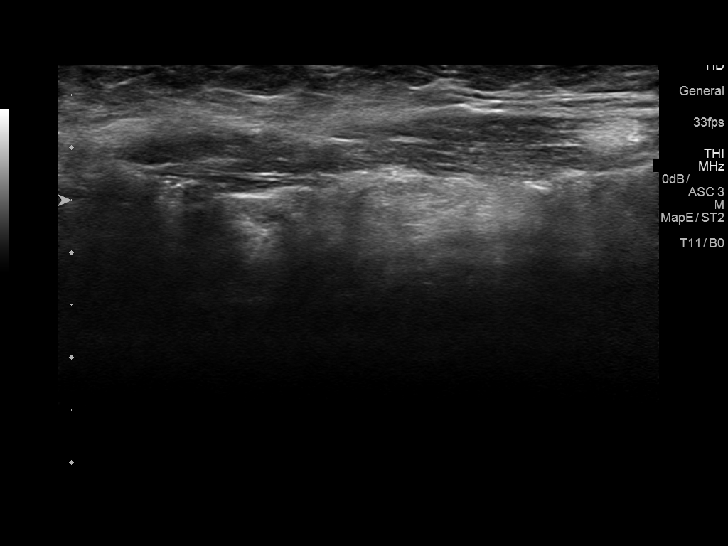
[im 30/72]
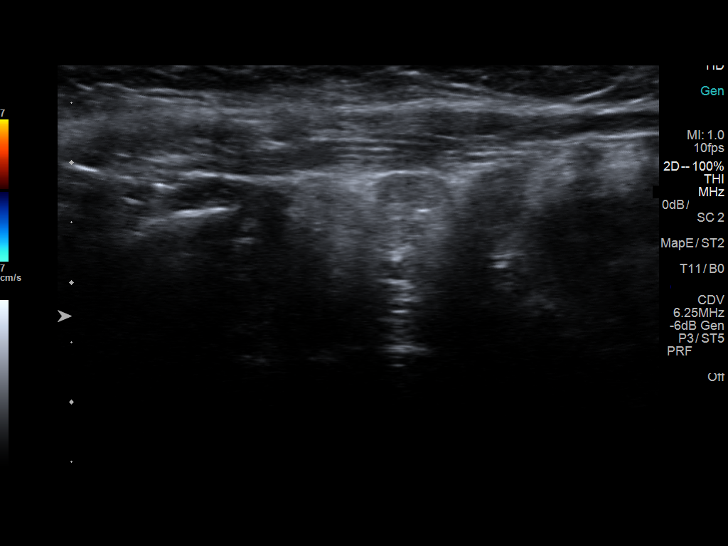
[im 36/72]
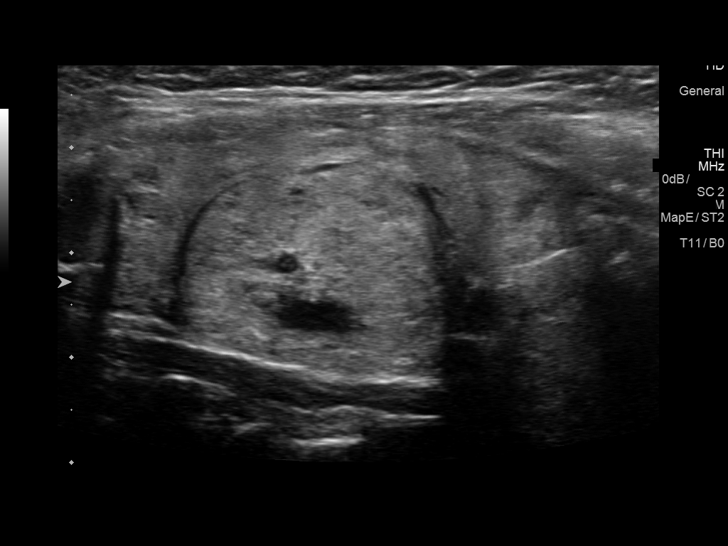
[im 42/72]
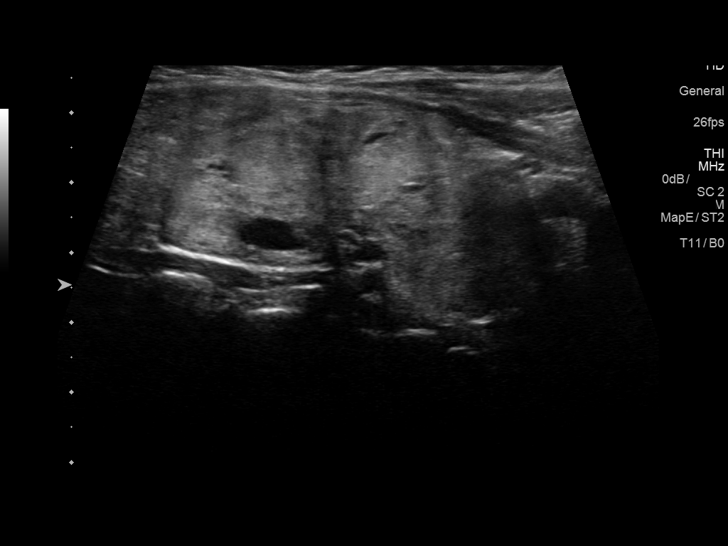
[im 48/72]
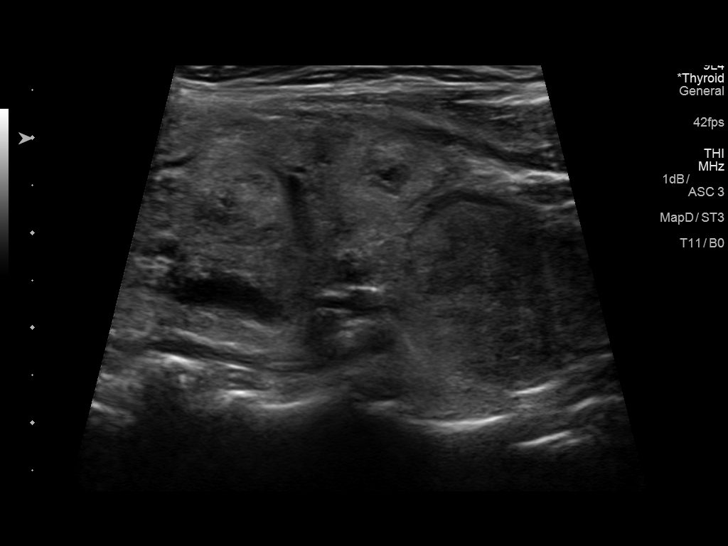
[im 54/72]
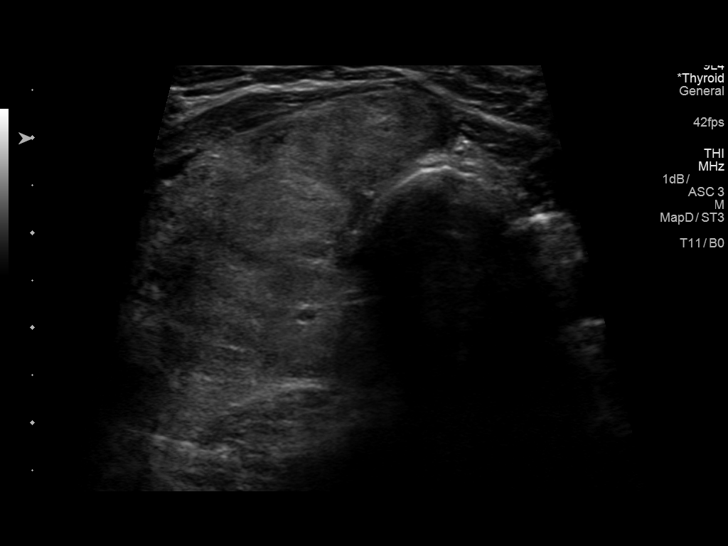
[im 60/72]
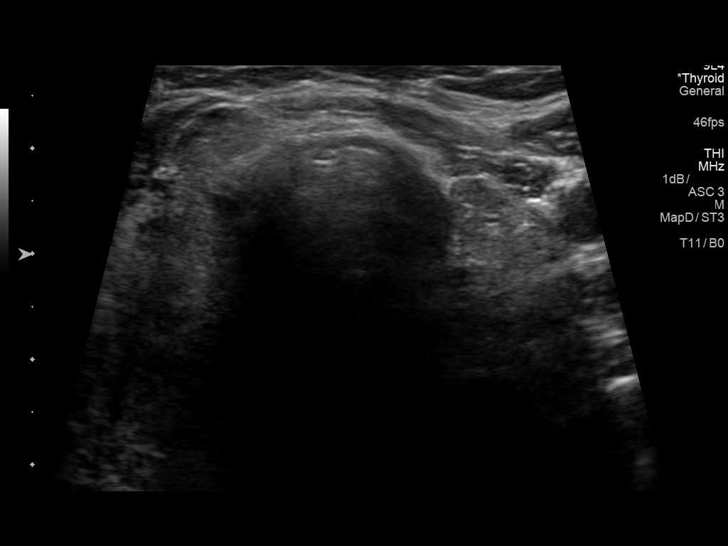
[im 66/72]
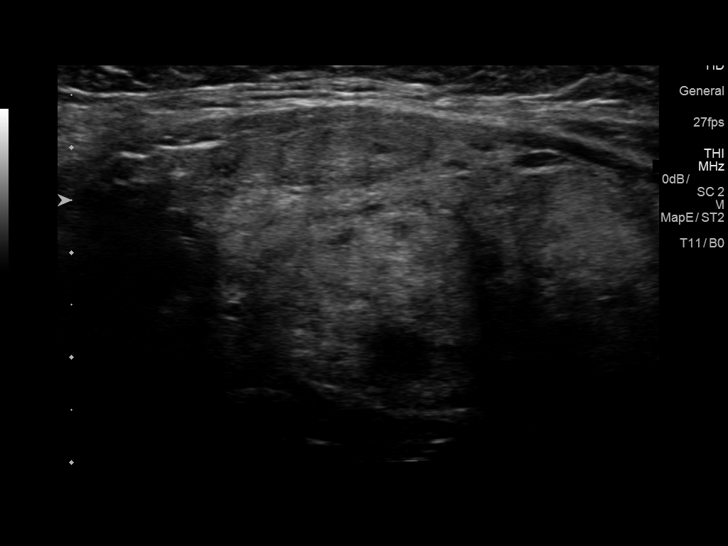
[im 72/72]
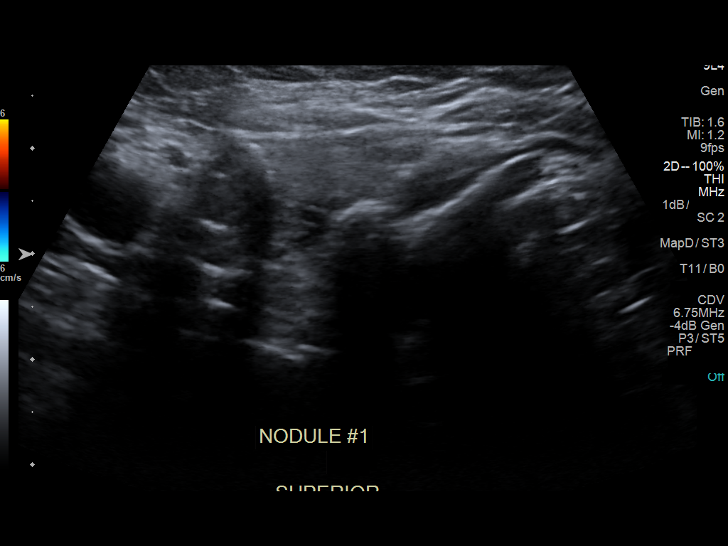

[13 of 25 positions shown; findings below may reference images not displayed]

FINDINGS: Parenchymal Echotexture: Markedly heterogenous

Isthmus: 0.7 cm, previously 0.8 cm

Right lobe: 6.5 x 2.8 x 2.6 cm, previously 8.0 x 2.6 x 3.4 cm

Left lobe: 0.7 x 0.3 x 0.7 cm soft tissue density in the left
thyroid bed.

_________________________________________________________

Estimated total number of nodules >/= 1 cm: 3

Number of spongiform nodules >/=  2 cm not described below (TR1): 0

Number of mixed cystic and solid nodules >/= 1.5 cm not described
below (TR2): 0

_________________________________________________________

Nodule # 2:

Location: Right; Superior

Maximum size: 1.5 cm; Other 2 dimensions: 1.2 x 0.6 cm

Composition: solid/almost completely solid (2)

Echogenicity: hypoechoic (2)

Shape: not taller-than-wide (0)

Margins: smooth (0)

Echogenic foci: none (0)

ACR TI-RADS total points: 4.

ACR TI-RADS risk category: TR4 (4-6 points).

ACR TI-RADS recommendations:

**Given size (>/= 1.5 cm) and appearance, fine needle aspiration of
this moderately suspicious nodule should be considered based on
TI-RADS criteria.

_________________________________________________________

Nodule # 4:

Location: Right; Inferior

Maximum size: 2.3 cm; Other 2 dimensions: 2.2 x 1.9 cm

Composition: solid/almost completely solid (2)

Echogenicity: hypoechoic (2)

Shape: taller-than-wide (3)

Margins: smooth (0)

Echogenic foci: none (0)

ACR TI-RADS total points: 7.

ACR TI-RADS risk category: TR5 (>/= 7 points).

ACR TI-RADS recommendations:

Right superior nodule 3 measures 2.6 x 1.7 x 2.2 cm and previously
measured 2.7 x 1.7 x 2.2 cm. Biopsy was performed 11/27/2007

_________________________________________________________
IMPRESSION: There is now soft tissue in the left thyroid bed measuring up to
cm. If the patient has a history of malignancy in the left lobe,
recurrent soft tissue mass cannot be excluded. Consider biopsy.

New right nodule 2 meets criteria for fine needle aspiration biopsy.

Right superior nodule 3 is stable and was previously biopsied.

Right lower nodule 4 is new and meets criteria for fine needle
aspiration biopsy.

The above is in keeping with the ACR TI-RADS recommendations - [HOSPITAL] 7941;[DATE].

## 2018-12-01 IMAGING — US US FNA BIOPSY THYROID 1ST LESION
1 series · 12 of 25 positions shown · non-contrast
Comparison: US THYROID 01/08/2018

MEDICATIONS:
5 mL 1% lidocaine

COMPLICATIONS:
None immediate.

INDICATION: Indeterminate thyroid nodule of the right superior and right
inferior thyroid. Also with new soft tissue growth in the left
thyroid bed status post left lobectomy. Request is made for
fine-needle aspiration of indeterminate thyroid nodules and left
thyroid bed.

EXAM:
ULTRASOUND GUIDED FINE NEEDLE ASPIRATION OF INDETERMINATE THYROID
NODULE
TECHNIQUE: Informed written consent was obtained from the patient after a
discussion of the risks, benefits and alternatives to treatment.
Questions regarding the procedure were encouraged and answered. A
timeout was performed prior to the initiation of the procedure.

[Series 1: us fna biopsy thyroid 1st lesion · 0.05mm/px · 45 acquisitions, 12 frames shown]
[im 2/45]
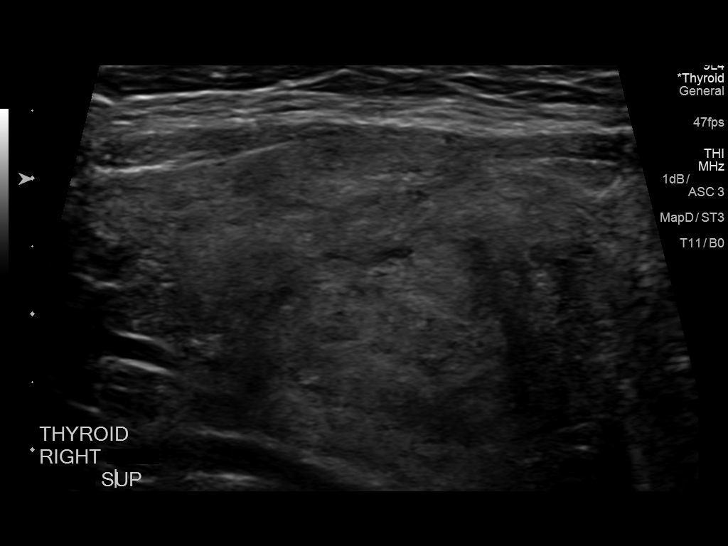
[im 6/45]
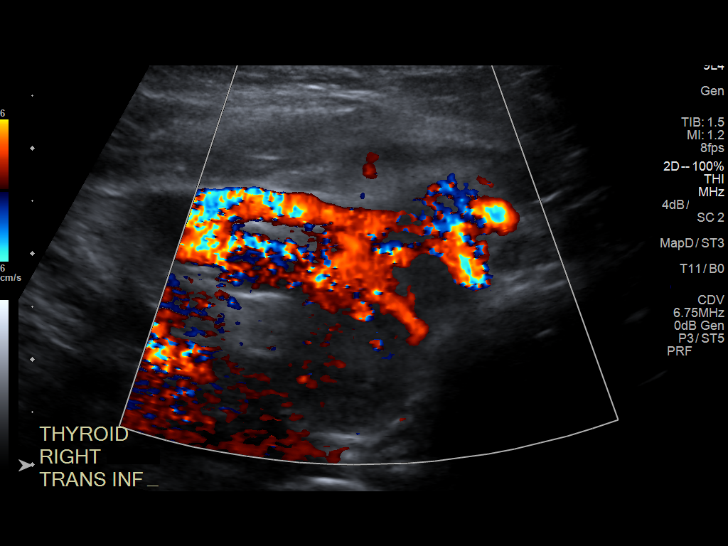
[im 10/45]
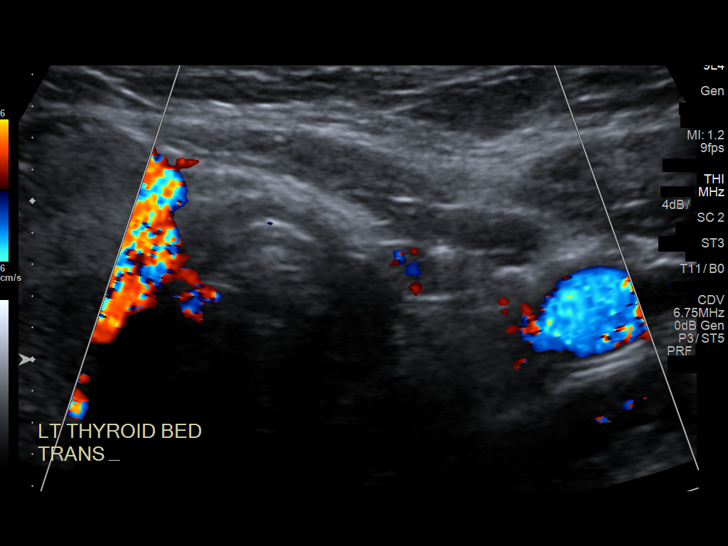
[im 13/45]
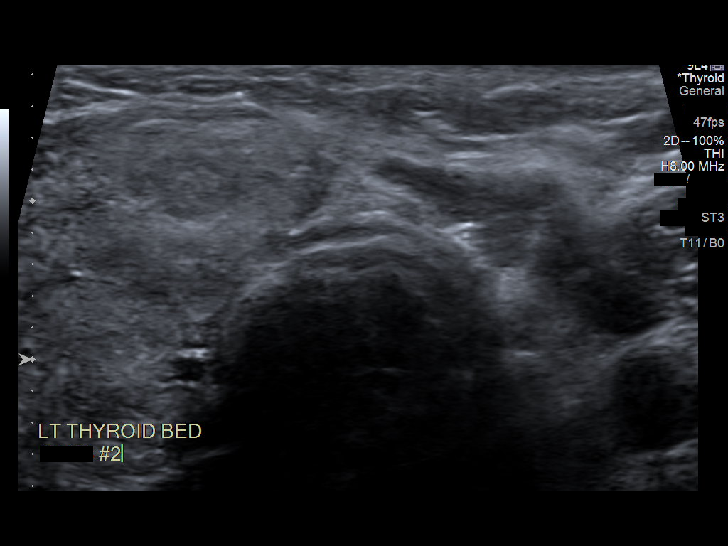
[im 17/45]
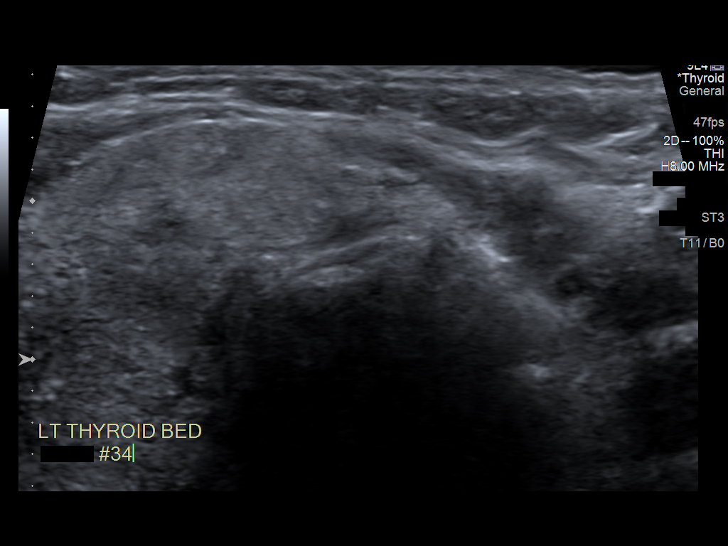
[im 21/45]
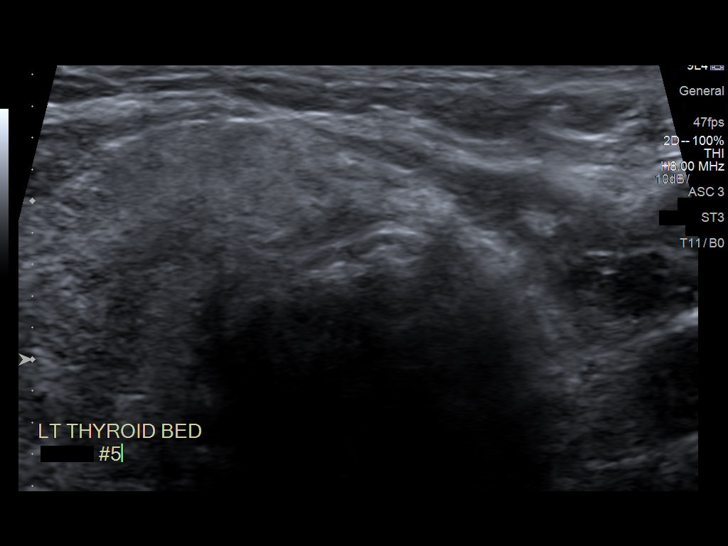
[im 24/45]
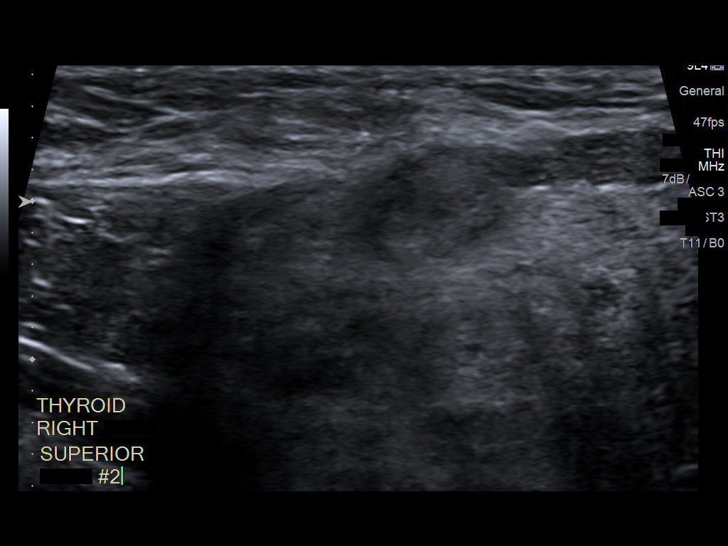
[im 28/45]
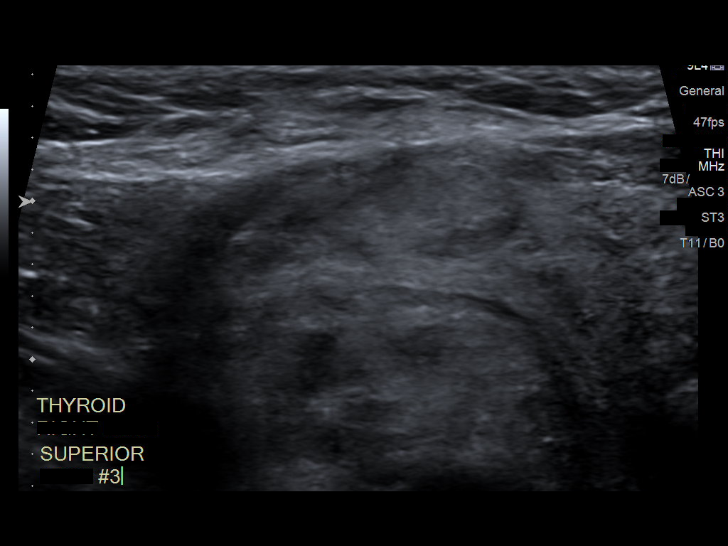
[im 32/45]
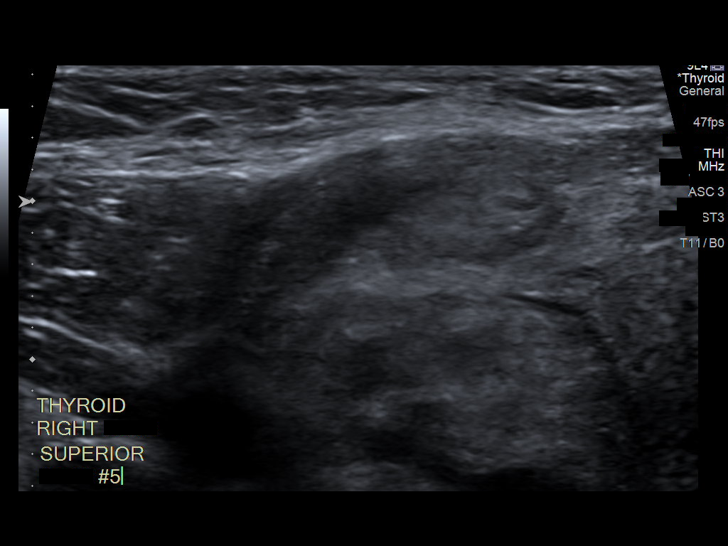
[im 35/45]
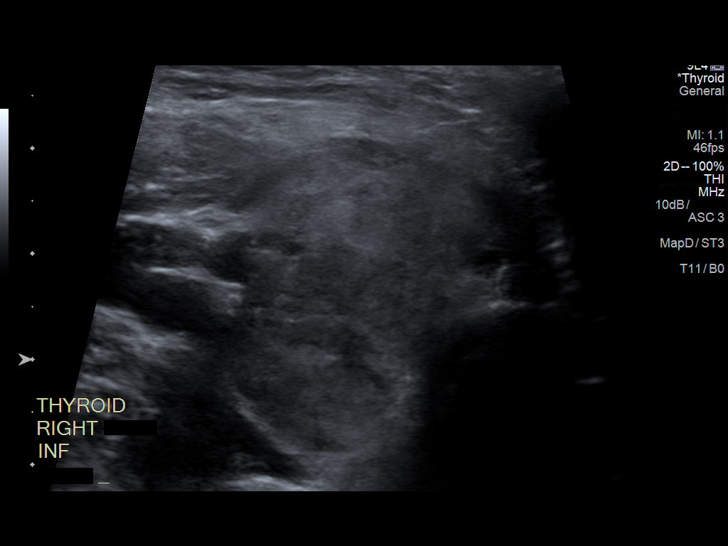
[im 39/45]
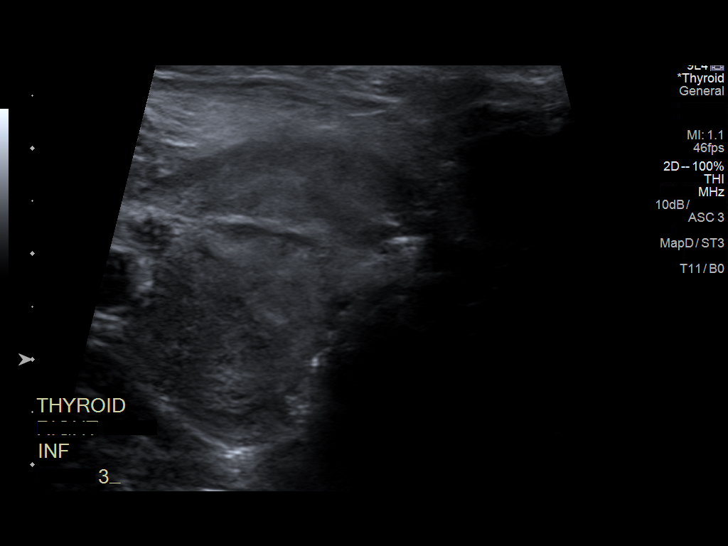
[im 43/45]
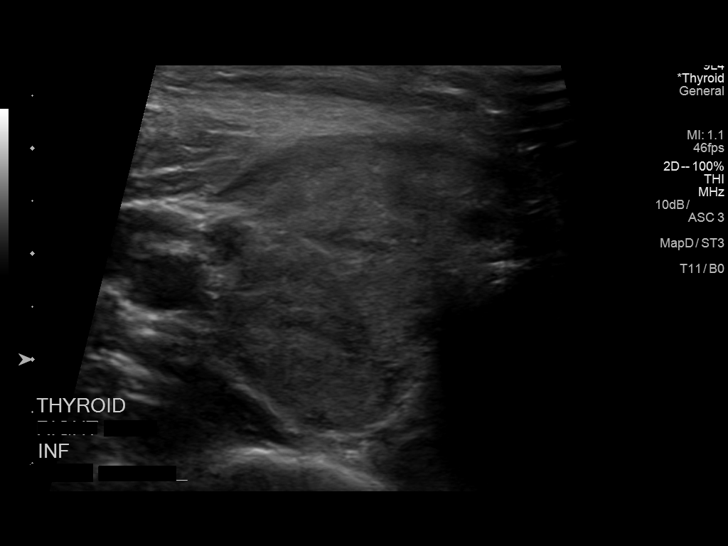

[12 of 25 positions shown; findings below may reference images not displayed]

Pre-procedural ultrasound scanning demonstrated unchanged size and
appearance of the indeterminate nodules within the right superior
and inferior thyroid.

The procedure was planned. The neck was prepped in the usual sterile
fashion, and a sterile drape was applied covering the operative
field. A timeout was performed prior to the initiation of the
procedure. Local anesthesia was provided with 1% lidocaine.

Under direct ultrasound guidance, 5 FNA biopsies were performed of
the soft tissue in the left thyroid bed with a 25 gauge needle.
Multiple ultrasound images were saved for procedural documentation
purposes. The samples were prepared and submitted to pathology.
Samples were also sent for Afirma testing.

Under direct ultrasound guidance, 5 FNA biopsies were performed of
the right superior thyroid nodule with a 25 gauge needle. Multiple
ultrasound images were saved for procedural documentation purposes.
The samples were prepared and submitted to pathology. Samples were
also sent for Afirma testing

Under direct ultrasound guidance, 5 FNA biopsies were performed of
the right inferior thyroid nodule with a 25 gauge needle. Multiple
ultrasound images were saved for procedural documentation purposes.
The samples were prepared and submitted to pathology. Samples were
also sent for Afirma testing

Limited post procedural scanning was negative for hematoma or
additional complication. Dressings were placed. The patient
tolerated the above procedures procedure well without immediate
postprocedural complication.
FINDINGS: Left lobe: 0.7 x 0.3 x 0.7 cm soft tissue density in the left
thyroid bed biopsied per physician request.

Nodule reference number based on prior diagnostic ultrasound: 2

Maximum size: 1.5 cm

Location: Right; Superior

ACR TI-RADS risk category: TR4 (4-6 points)

Reason for biopsy: meets ACR TI-RADS criteria

_________________________________________________________

Nodule reference number based on prior diagnostic ultrasound: 4

Maximum size: 2.3 cm

Location: Right; Inferior

ACR TI-RADS risk category: TR5 (>/= 7 points)

Reason for biopsy: meets ACR TI-RADS criteria

Ultrasound imaging confirms appropriate placement of the needles
within the thyroid nodule.
IMPRESSION: 1. Technically successful ultrasound guided fine needle aspiration
of soft tissue in the left thyroid bed.
2. Technically successful ultrasound guided fine needle aspiration
of right superior thyroid nodule.
3. Technically successful ultrasound-guided fine needle aspiration
of right inferior thyroid nodule.

## 2019-03-25 DIAGNOSIS — H0012 Chalazion right lower eyelid: Secondary | ICD-10-CM | POA: Diagnosis not present

## 2019-05-27 DIAGNOSIS — R002 Palpitations: Secondary | ICD-10-CM | POA: Diagnosis not present

## 2019-05-27 DIAGNOSIS — R0789 Other chest pain: Secondary | ICD-10-CM | POA: Diagnosis not present

## 2019-05-27 DIAGNOSIS — I493 Ventricular premature depolarization: Secondary | ICD-10-CM | POA: Diagnosis not present

## 2019-07-14 DIAGNOSIS — I493 Ventricular premature depolarization: Secondary | ICD-10-CM | POA: Diagnosis not present

## 2019-07-14 DIAGNOSIS — R42 Dizziness and giddiness: Secondary | ICD-10-CM | POA: Diagnosis not present

## 2019-07-14 DIAGNOSIS — F418 Other specified anxiety disorders: Secondary | ICD-10-CM | POA: Diagnosis not present

## 2019-07-14 DIAGNOSIS — R200 Anesthesia of skin: Secondary | ICD-10-CM | POA: Diagnosis not present

## 2019-08-01 DIAGNOSIS — R42 Dizziness and giddiness: Secondary | ICD-10-CM | POA: Diagnosis not present

## 2019-08-01 DIAGNOSIS — R200 Anesthesia of skin: Secondary | ICD-10-CM | POA: Diagnosis not present

## 2019-08-21 DIAGNOSIS — Z01411 Encounter for gynecological examination (general) (routine) with abnormal findings: Secondary | ICD-10-CM | POA: Diagnosis not present

## 2019-08-21 DIAGNOSIS — Z1231 Encounter for screening mammogram for malignant neoplasm of breast: Secondary | ICD-10-CM | POA: Diagnosis not present

## 2019-08-21 DIAGNOSIS — Z6831 Body mass index (BMI) 31.0-31.9, adult: Secondary | ICD-10-CM | POA: Diagnosis not present

## 2019-08-21 DIAGNOSIS — N952 Postmenopausal atrophic vaginitis: Secondary | ICD-10-CM | POA: Diagnosis not present

## 2019-12-29 DIAGNOSIS — E039 Hypothyroidism, unspecified: Secondary | ICD-10-CM | POA: Diagnosis not present

## 2020-02-03 DIAGNOSIS — E039 Hypothyroidism, unspecified: Secondary | ICD-10-CM | POA: Diagnosis not present

## 2020-02-03 DIAGNOSIS — R002 Palpitations: Secondary | ICD-10-CM | POA: Diagnosis not present

## 2020-02-03 DIAGNOSIS — R635 Abnormal weight gain: Secondary | ICD-10-CM | POA: Diagnosis not present

## 2020-02-03 DIAGNOSIS — E049 Nontoxic goiter, unspecified: Secondary | ICD-10-CM | POA: Diagnosis not present

## 2020-02-04 ENCOUNTER — Other Ambulatory Visit: Payer: Self-pay | Admitting: Endocrinology

## 2020-02-04 DIAGNOSIS — E049 Nontoxic goiter, unspecified: Secondary | ICD-10-CM

## 2020-02-09 ENCOUNTER — Ambulatory Visit
Admission: RE | Admit: 2020-02-09 | Discharge: 2020-02-09 | Disposition: A | Discharge status: Home / Self Care | Payer: BC Managed Care – PPO | Source: Ambulatory Visit | Attending: Endocrinology | Admitting: Endocrinology

## 2020-02-09 DIAGNOSIS — E041 Nontoxic single thyroid nodule: Secondary | ICD-10-CM | POA: Diagnosis not present

## 2020-02-09 DIAGNOSIS — E049 Nontoxic goiter, unspecified: Secondary | ICD-10-CM

## 2020-12-28 IMAGING — US US THYROID
1 series · 12 of 25 positions shown · non-contrast
Comparison: 01/07/2018;

CLINICAL DATA: Prior ultrasound follow-up. History of left thyroid
lobectomy and previous fine needle aspiration of right upper and
lower lobe thyroid nodules as well as residual tissue within left
thyroid lobectomy resection bed (performed 02/06/2018), as well as
remote history of right mid thyroid nodule fine-needle aspiration
performed 11/27/2007.

EXAM:
THYROID ULTRASOUND
TECHNIQUE: Ultrasound examination of the thyroid gland and adjacent soft
tissues was performed.

[Series 1: us thyroid · 0.06mm/px · 12 of 108 slices shown]
[im 5/108]
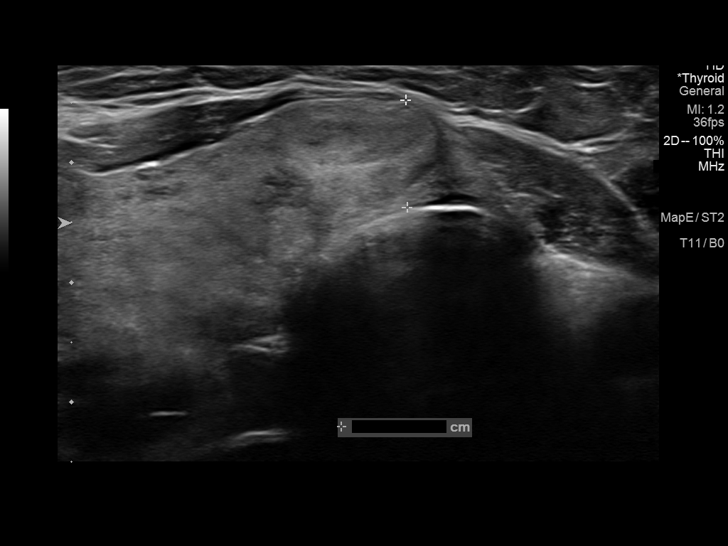
[im 14/108]
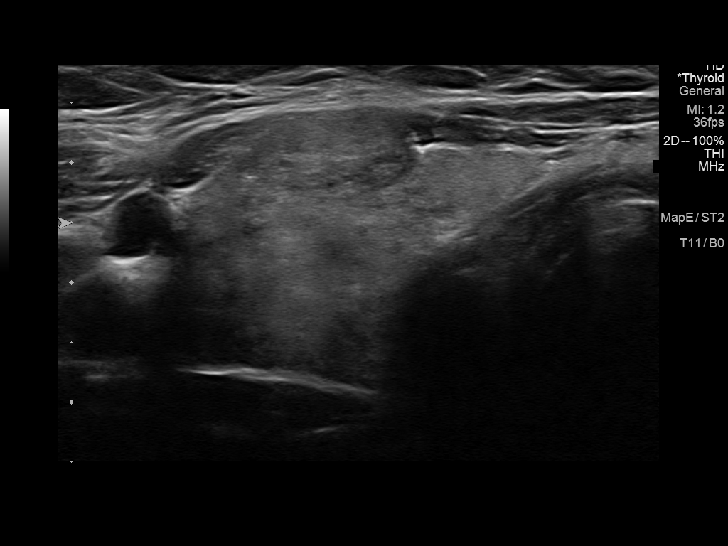
[im 23/108]
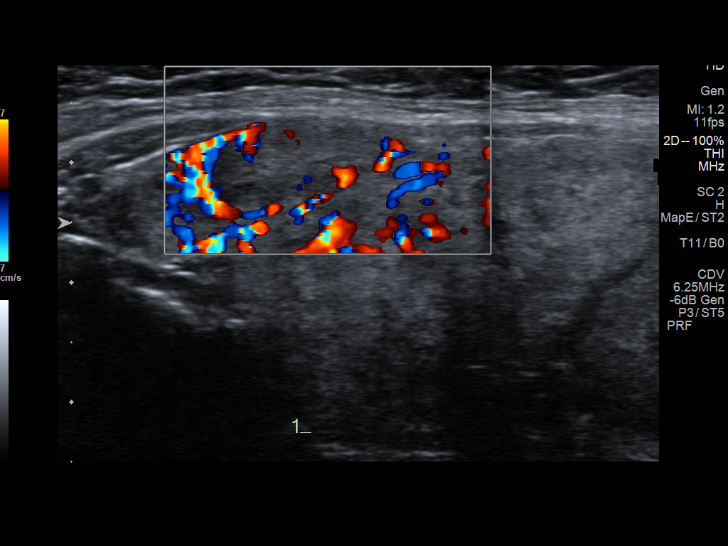
[im 32/108]
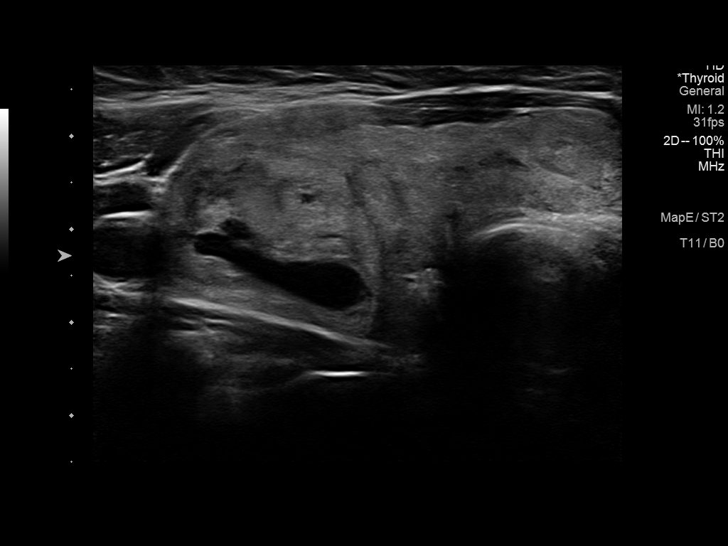
[im 41/108]
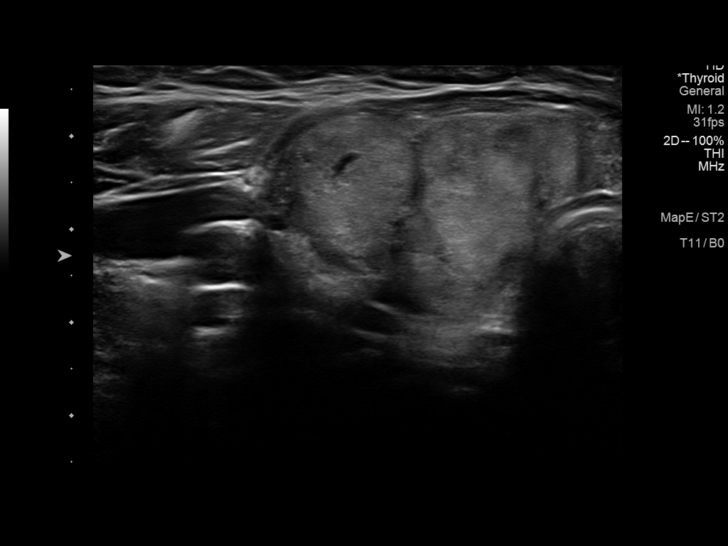
[im 50/108]
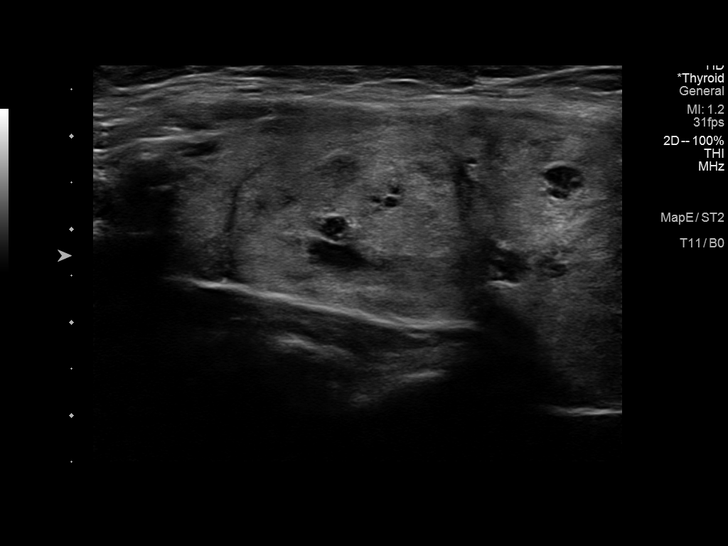
[im 58/108]
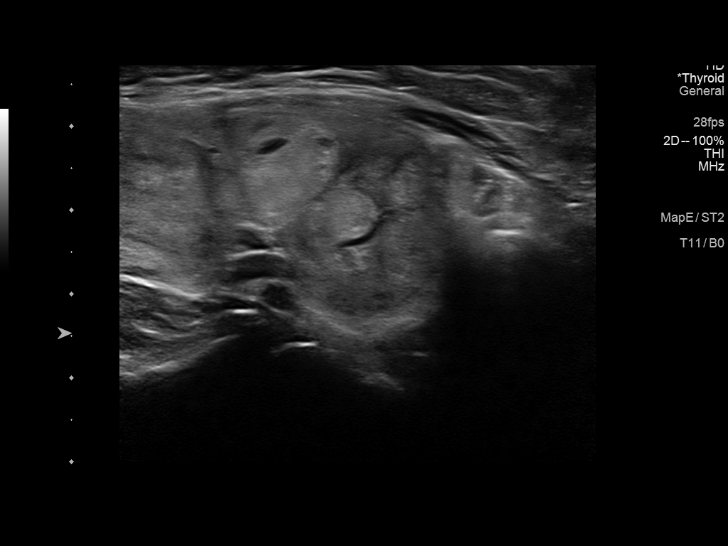
[im 67/108]
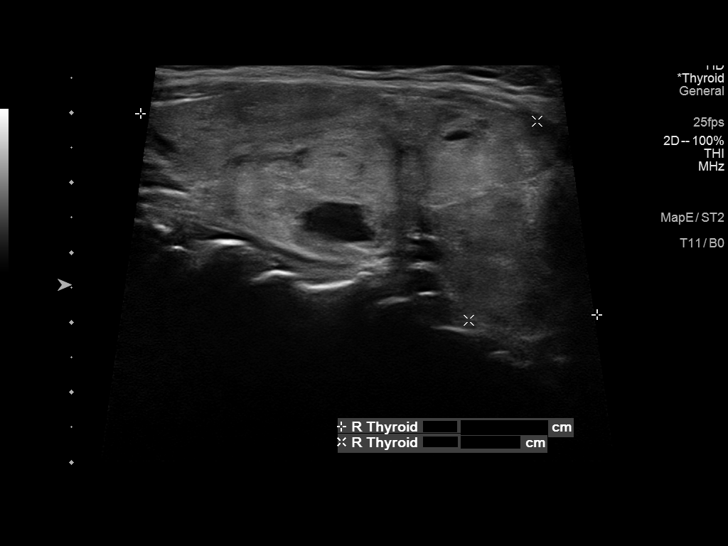
[im 76/108]
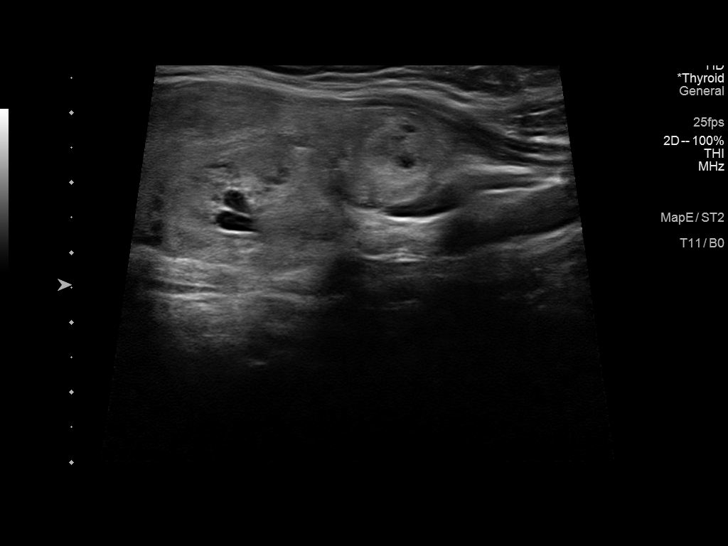
[im 85/108]
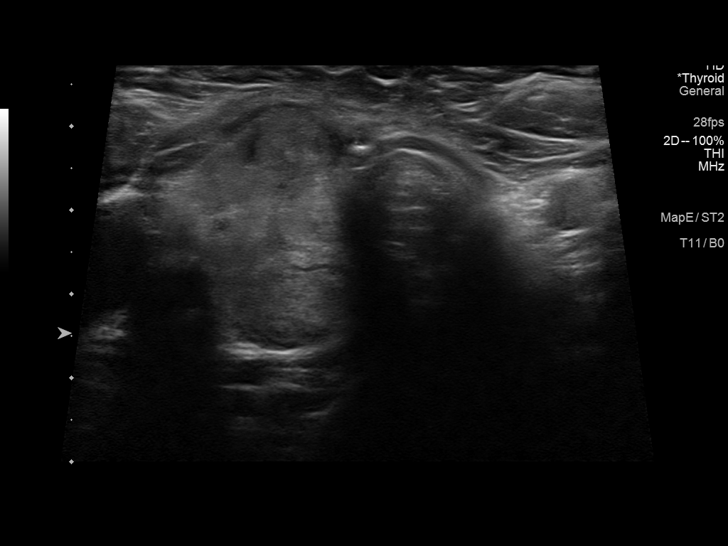
[im 94/108]
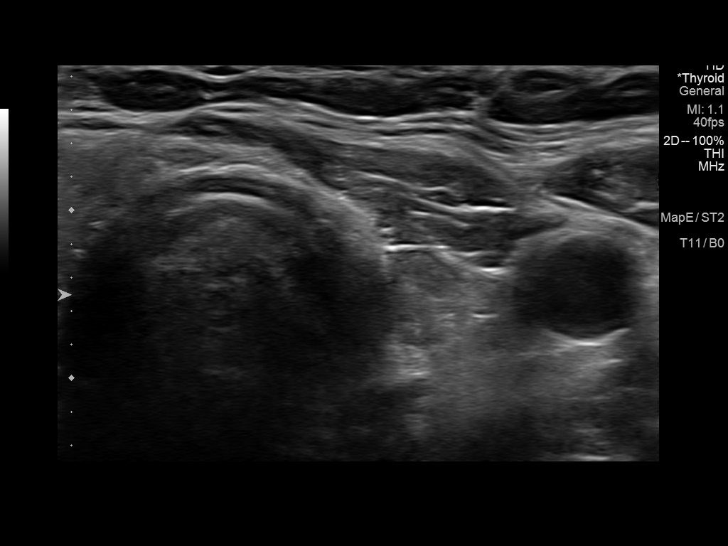
[im 103/108]
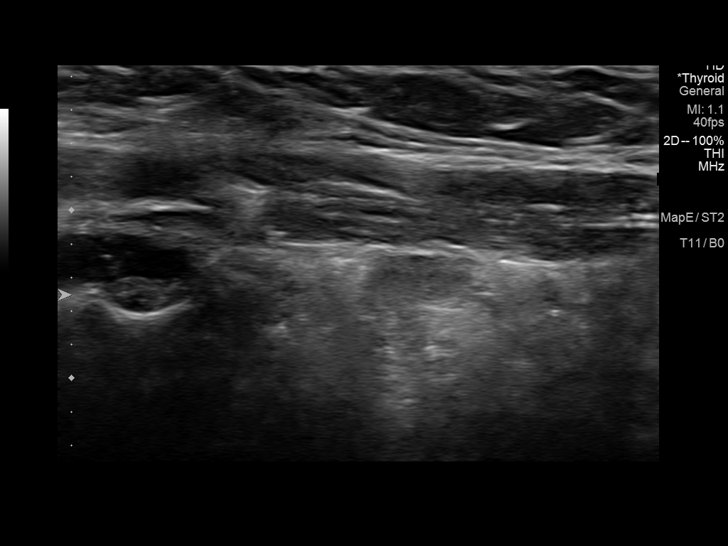

[12 of 25 positions shown; findings below may reference images not displayed]

10/23/2007; ultrasound-guided biopsy of
right-sided superior and inferior thyroid nodules and nodular tissue
within the left lobectomy resection bed-02/06/2018;
ultrasound-guided biopsy of right mid thyroid nodule-11/27/2007
FINDINGS: Parenchymal Echotexture: Moderately heterogenous

Isthmus: Enlarged measuring 0.9 cm in diameter, unchanged

Right lobe: Enlarged measuring 7.1 x 3.0 x 3.0 cm, previously, 8.0 x
2.6 x 3.4 cm

Left lobe: Previously biopsied approximately 0.7 cm hypoechoic
nodular tissue within the left thyroid lobectomy resection bed is
unchanged compared to the [DATE] examination, previously, 0.7 cm.

_________________________________________________________

Estimated total number of nodules >/= 1 cm: 2

Number of spongiform nodules >/=  2 cm not described below (TR1): 0

Number of mixed cystic and solid nodules >/= 1.5 cm not described
below (TR2): 0

_________________________________________________________

Previously biopsied 1.5 x 1.3 x 0.7 cm isoechoic nodule within the
superior pole the right lobe of the thyroid (labeled 1), is
unchanged compared to the [DATE] examination, previously, 1.5 x
x 0.6 cm. Correlation previous biopsy results is advised.

The previously biopsied approximately 2.6 x 2.3 x 1.8 cm isoechoic
ill-defined nodule/pseudonodule within the mid aspect the right lobe
of the thyroid (labeled 2), is unchanged compared to the [DATE]
examination, previously, 2.6 x 2.2 x 1.7 cm appears similar to
remote examination performed [DATE], previously, 2.4 x 1.6 by
cm with slight size differences attributable to interval partial
cystic degeneration, a typically benign finding. Correlation with
previous biopsy results is advised.

Previously biopsied approximately 2.1 x 1.8 cm hypoechoic nodule
within the inferior pole the right lobe of the thyroid (labeled 3),
is unchanged compared to the [DATE] examination, previously, 2.3 x
2.2 x 1.9 cm. Correlation with previous biopsy results is advised.
IMPRESSION: 1. Stable sequela of left thyroid lobectomy with similar findings of
multinodular goiter within the remaining thyroid parenchyma. No
definitive worrisome new or enlarging thyroid nodules.
2. Previously biopsied 0.7 cm hypoechoic nodular tissue within the
left lobectomy resection bed is unchanged compared to the [DATE]
examination.
3. Recently biopsied right upper and lower pole thyroid nodules are
unchanged compared to [DATE] examination. Previously biopsied right
mid pole thyroid nodule is grossly unchanged compared to remote
examination performed in 9446. Assuming benign pathologic diagnoses,
repeat sampling and/or continued dedicated follow-up is not
recommended.

The above is in keeping with the ACR TI-RADS recommendations - [HOSPITAL] 7126;[DATE].
